# Patient Record
Sex: Male | Born: 1970 | Race: White | Hispanic: No | Marital: Single | Smoking: Current every day smoker
Health system: Southern US, Community
[De-identification: ages and names within clinical notes are randomized; demographics above are authoritative.]

## PROBLEM LIST (undated history)

## (undated) DIAGNOSIS — I639 Cerebral infarction, unspecified: Secondary | ICD-10-CM

## (undated) DIAGNOSIS — D6861 Antiphospholipid syndrome: Secondary | ICD-10-CM

## (undated) DIAGNOSIS — I82409 Acute embolism and thrombosis of unspecified deep veins of unspecified lower extremity: Secondary | ICD-10-CM

## (undated) DIAGNOSIS — I10 Essential (primary) hypertension: Secondary | ICD-10-CM

## (undated) DIAGNOSIS — R319 Hematuria, unspecified: Secondary | ICD-10-CM

## (undated) DIAGNOSIS — G8929 Other chronic pain: Secondary | ICD-10-CM

## (undated) DIAGNOSIS — M549 Dorsalgia, unspecified: Secondary | ICD-10-CM

## (undated) DIAGNOSIS — D759 Disease of blood and blood-forming organs, unspecified: Secondary | ICD-10-CM

## (undated) DIAGNOSIS — I739 Peripheral vascular disease, unspecified: Secondary | ICD-10-CM

## (undated) HISTORY — PX: CARPAL TUNNEL RELEASE: SHX101

## (undated) HISTORY — DX: Antiphospholipid syndrome: D68.61

## (undated) HISTORY — DX: Essential (primary) hypertension: I10

## (undated) HISTORY — PX: GALLBLADDER SURGERY: SHX652

## (undated) HISTORY — PX: THROMBECTOMY: SHX45

## (undated) HISTORY — PX: TOE SURGERY: SHX1073

## (undated) HISTORY — PX: HERNIA REPAIR: SHX51

## (undated) HISTORY — DX: Acute embolism and thrombosis of unspecified deep veins of unspecified lower extremity: I82.409

---

## 2012-01-02 ENCOUNTER — Encounter: Payer: Self-pay | Admitting: Family

## 2012-01-02 ENCOUNTER — Ambulatory Visit (INDEPENDENT_AMBULATORY_CARE_PROVIDER_SITE_OTHER): Payer: Self-pay | Admitting: Family

## 2012-01-02 VITALS — BP 140/92 | HR 100 | Temp 98.5°F | Ht 71.0 in | Wt 268.0 lb

## 2012-01-02 DIAGNOSIS — Z0289 Encounter for other administrative examinations: Secondary | ICD-10-CM

## 2012-01-02 LAB — POCT URINALYSIS DIPSTICK
Bilirubin, UA: NEGATIVE
Glucose, UA: NEGATIVE
Ketones, UA: NEGATIVE
Spec Grav, UA: 1.02
Urobilinogen, UA: 0.2

## 2012-01-02 NOTE — Progress Notes (Signed)
  Subjective:    Patient ID: Dominic Walters, male    DOB: 1970-12-08, 41 y.o.   MRN: [id number]  HPI 41 year old, white male, nonsmoker is in today for a DOT Physical Exam. Denies any concerns today.    Review of Systems  Constitutional: Negative.   HENT: Negative.   Eyes: Negative.   Respiratory: Negative.   Cardiovascular: Negative.   Gastrointestinal: Negative.   Genitourinary: Negative.   Musculoskeletal: Negative.   Skin: Negative.   Neurological: Negative.   Psychiatric/Behavioral: Negative.    Past Medical History  Diagnosis Date  . Hypertension     History   Social History  . Marital Status: Single    Spouse Name: N/A    Number of Children: N/A  . Years of Education: N/A   Occupational History  . Not on file.   Social History Main Topics  . Smoking status: Current Every Day Smoker -- 1.0 packs/day    Types: Cigarettes  . Smokeless tobacco: Not on file  . Alcohol Use: No  . Drug Use: No  . Sexually Active: Not on file   Other Topics Concern  . Not on file   Social History Narrative  . No narrative on file    Past Surgical History  Procedure Date  . Carpal tunnel release     Family History  Problem Relation Age of Onset  . Arthritis Mother   . Hypertension Mother   . Cancer Father     lung  . Heart disease Father   . Diabetes Maternal Grandmother   . Hypertension Maternal Grandmother   . Heart disease Maternal Grandmother     No Known Allergies  Current Outpatient Prescriptions on File Prior to Visit  Medication Sig Dispense Refill  . lisinopril (PRINIVIL,ZESTRIL) 5 MG tablet Take 5 mg by mouth daily.        BP 140/92  Pulse 100  Temp 98.5 F (36.9 C) (Oral)  Ht 5\' 11"  (1.803 m)  Wt 268 lb (121.564 kg)  BMI 37.38 kg/m2  SpO2 98%chart    Objective:   Physical Exam  Constitutional: He is oriented to person, place, and time. He appears well-developed and well-nourished.  HENT:  Head: Normocephalic and atraumatic.  Right Ear:  External ear normal.  Left Ear: External ear normal.  Nose: Nose normal.  Mouth/Throat: Oropharynx is clear and moist.  Eyes: Conjunctivae normal and EOM are normal. Pupils are equal, round, and reactive to light.  Neck: Normal range of motion. Neck supple. No thyromegaly present.  Cardiovascular: Normal rate, regular rhythm and normal heart sounds.   Pulmonary/Chest: Effort normal and breath sounds normal.  Abdominal: Soft. Bowel sounds are normal.  Musculoskeletal: Normal range of motion.  Neurological: He is alert and oriented to person, place, and time. He has normal reflexes.  Skin: Skin is warm and dry.  Psychiatric: He has a normal mood and affect.     Vision: 20/20 Hearing exam normal     Assessment & Plan:  Assessment: DOT physical exam  Plan: Form completed. Advised CPX with prostate exam and fasting blood work. Call the office with any questions or concerns. Recheck as scheduled

## 2012-10-21 ENCOUNTER — Ambulatory Visit: Payer: Self-pay | Admitting: Family

## 2012-10-21 ENCOUNTER — Ambulatory Visit (INDEPENDENT_AMBULATORY_CARE_PROVIDER_SITE_OTHER): Payer: BC Managed Care – PPO | Admitting: Family

## 2012-10-21 ENCOUNTER — Encounter: Payer: Self-pay | Admitting: Family

## 2012-10-21 VITALS — BP 122/80 | HR 67 | Wt 288.6 lb

## 2012-10-21 DIAGNOSIS — M5416 Radiculopathy, lumbar region: Secondary | ICD-10-CM

## 2012-10-21 DIAGNOSIS — I10 Essential (primary) hypertension: Secondary | ICD-10-CM

## 2012-10-21 DIAGNOSIS — IMO0002 Reserved for concepts with insufficient information to code with codable children: Secondary | ICD-10-CM

## 2012-10-21 MED ORDER — HYDROCODONE-ACETAMINOPHEN 10-325 MG PO TABS: 1.0000 | ORAL_TABLET | Freq: Three times a day (TID) | ORAL | Status: DC | PRN

## 2012-10-21 MED ORDER — PREDNISONE 20 MG PO TABS: ORAL_TABLET | ORAL | Status: DC

## 2012-10-21 NOTE — Progress Notes (Signed)
Subjective:    Patient ID: Dominic Walters, male    DOB: [birth year], 42 y.o.   MRN: [id number]  HPI 42 year old white male, one pack per day smoker is in with complaints of pain in the right sciatic nerve area x1 year. The pain has recently intensified and is not responding to ibuprofen and naproxen anymore. He had been taken an anti-inflammatory daily. He rates the pain as 6-7/10, worse with certain movements. He's also been trying massage his biweekly that initially were held and then a longer helping. The pain radiates to his right gluteal area down the leg into the right foot. He describes it as a dull ache. Reports a back injury in the 1990s requiring cortisone injections at that time. He has not had any new images of his back.  Patient is currently taking lisinopril 5 mg once daily for his blood pressure and tolerating it well.   Review of Systems  Constitutional: Negative.   HENT: Negative.   Respiratory: Negative.   Gastrointestinal: Negative.   Endocrine: Negative.   Musculoskeletal: Positive for back pain.       Radiates down the right leg  Skin: Negative.   Neurological: Negative.   Psychiatric/Behavioral: Negative.    Past Medical History  Diagnosis Date  . Hypertension     History   Social History  . Marital Status: Single    Spouse Name: N/A    Number of Children: N/A  . Years of Education: N/A   Occupational History  . Not on file.   Social History Main Topics  . Smoking status: Former Smoker -- 1.00 packs/day    Types: Cigarettes  . Smokeless tobacco: Not on file  . Alcohol Use: No  . Drug Use: No  . Sexual Activity: Not on file   Other Topics Concern  . Not on file   Social History Narrative  . No narrative on file    Past Surgical History  Procedure Laterality Date  . Carpal tunnel release      Family History  Problem Relation Age of Onset  . Arthritis Mother   . Hypertension Mother   . Cancer Father     lung  . Heart disease Father    . Diabetes Maternal Grandmother   . Hypertension Maternal Grandmother   . Heart disease Maternal Grandmother     No Known Allergies  Current Outpatient Prescriptions on File Prior to Visit  Medication Sig Dispense Refill  . lisinopril (PRINIVIL,ZESTRIL) 5 MG tablet Take 5 mg by mouth daily.       No current facility-administered medications on file prior to visit.    BP 122/80  Pulse 67  Wt 288 lb 9.6 oz (130.908 kg)  BMI 40.27 kg/m2chart    Objective:   Physical Exam  Constitutional: He is oriented to person, place, and time. He appears well-developed and well-nourished.  HENT:  Right Ear: External ear normal.  Left Ear: External ear normal.  Nose: Nose normal.  Mouth/Throat: Oropharynx is clear and moist.  Neck: Normal range of motion. Neck supple.  Cardiovascular: Normal rate, regular rhythm and normal heart sounds.   Pulmonary/Chest: Effort normal and breath sounds normal.  Abdominal: Soft. Bowel sounds are normal.  Musculoskeletal: He exhibits tenderness.  No pain to tenderness of the low back. Pain to tenderness of the right sciatic nerve. Negative SLR. Pulses 2/2  Neurological: He is alert and oriented to person, place, and time. He has normal reflexes. He displays normal reflexes. No cranial nerve deficit.  Coordination normal.  Skin: Skin is warm and dry.  Psychiatric: He has a normal mood and affect.          Assessment & Plan:  Assessment: 1. Lumbar Radiculopathy 2. Hypertension  Plan: Prednisone as directed. We will consider an MRI of the back if his symptoms persist. Low back strengthening exercises provided for him today. I will recheck the patient in 2 weeks to see if we need to order an MRI at that time or if he is better.

## 2012-10-21 NOTE — Patient Instructions (Addendum)

## 2012-10-28 ENCOUNTER — Telehealth: Payer: Self-pay | Admitting: Family

## 2012-10-28 ENCOUNTER — Other Ambulatory Visit: Payer: Self-pay | Admitting: Family

## 2012-10-28 ENCOUNTER — Ambulatory Visit (HOSPITAL_BASED_OUTPATIENT_CLINIC_OR_DEPARTMENT_OTHER)
Admission: RE | Admit: 2012-10-28 | Discharge: 2012-10-28 | Disposition: A | Discharge status: Home / Self Care | Payer: BC Managed Care – PPO | Source: Ambulatory Visit | Attending: Family | Admitting: Family

## 2012-10-28 DIAGNOSIS — M545 Low back pain, unspecified: Secondary | ICD-10-CM | POA: Insufficient documentation

## 2012-10-28 DIAGNOSIS — M5431 Sciatica, right side: Secondary | ICD-10-CM

## 2012-10-28 DIAGNOSIS — M79609 Pain in unspecified limb: Secondary | ICD-10-CM | POA: Insufficient documentation

## 2012-10-28 DIAGNOSIS — M5126 Other intervertebral disc displacement, lumbar region: Secondary | ICD-10-CM | POA: Insufficient documentation

## 2012-10-28 MED ORDER — PREDNISONE 20 MG PO TABS: 60.0000 mg | ORAL_TABLET | Freq: Every day | ORAL | Status: DC

## 2012-10-28 NOTE — Telephone Encounter (Signed)
Pt f/u office call regarding MRI.  Advised Pt order was placed on 9-2 for MRI Lumbar by Padonda, PA.  Pt verbalized understanding.

## 2012-10-29 ENCOUNTER — Other Ambulatory Visit: Payer: Self-pay | Admitting: Family

## 2012-10-29 DIAGNOSIS — IMO0002 Reserved for concepts with insufficient information to code with codable children: Secondary | ICD-10-CM

## 2012-10-29 DIAGNOSIS — M5126 Other intervertebral disc displacement, lumbar region: Secondary | ICD-10-CM

## 2012-10-31 ENCOUNTER — Encounter: Payer: Self-pay | Admitting: Family

## 2012-11-03 ENCOUNTER — Telehealth: Payer: Self-pay | Admitting: Family

## 2012-11-03 MED ORDER — PREDNISONE 20 MG PO TABS: 60.0000 mg | ORAL_TABLET | Freq: Every day | ORAL | Status: AC

## 2012-11-03 MED ORDER — OXYCODONE-ACETAMINOPHEN 10-325 MG PO TABS: 1.0000 | ORAL_TABLET | Freq: Three times a day (TID) | ORAL | Status: DC | PRN

## 2012-11-03 NOTE — Telephone Encounter (Signed)
Pt would like to discuss the meds :predniSONE (DELTASONE) 20 MG tablet and HYDROcodone-acetaminophen (NORCO) 10-325 MG per tablet Pt refused to give any more info. Pls call

## 2012-11-03 NOTE — Telephone Encounter (Signed)
Pick up RX

## 2012-11-04 DIAGNOSIS — I639 Cerebral infarction, unspecified: Secondary | ICD-10-CM

## 2012-11-04 HISTORY — DX: Cerebral infarction, unspecified: I63.9

## 2012-11-06 ENCOUNTER — Ambulatory Visit (INDEPENDENT_AMBULATORY_CARE_PROVIDER_SITE_OTHER): Payer: BC Managed Care – PPO | Admitting: Family

## 2012-11-06 ENCOUNTER — Encounter: Payer: Self-pay | Admitting: Family

## 2012-11-06 VITALS — BP 138/82 | HR 78 | Wt 278.0 lb

## 2012-11-06 DIAGNOSIS — I10 Essential (primary) hypertension: Secondary | ICD-10-CM

## 2012-11-06 DIAGNOSIS — H8110 Benign paroxysmal vertigo, unspecified ear: Secondary | ICD-10-CM

## 2012-11-06 DIAGNOSIS — F430 Acute stress reaction: Secondary | ICD-10-CM

## 2012-11-06 LAB — TSH: TSH: 0.63 u[IU]/mL (ref 0.35–5.50)

## 2012-11-06 MED ORDER — DIAZEPAM 5 MG PO TABS: 5.0000 mg | ORAL_TABLET | Freq: Two times a day (BID) | ORAL | Status: DC | PRN

## 2012-11-06 NOTE — Patient Instructions (Signed)

## 2012-11-06 NOTE — Progress Notes (Signed)
Subjective:    Patient ID: Dominic Walters, male    DOB: [birth year], 42 y.o.   MRN: [id number]  HPI  42 year old white male, nonsmoker is in today for hospital followup. He was seen 2 days ago at [facility] with sudden onset of dizziness slow speech that was presumed to be a CVA. He had a CT scan and MRI of the brain that were both negative. Cardiac enzymes were negative. All blood work was normal. He was diagnosed with benign paroxysmal vertigo and given meclizine. Since that time continued to have dizziness that responds well to meclizine but it causes drowsiness. He's been unable to ambulate without assistance due to intense dizziness. His fiance is concerned that he may have mercury poison because the night before he had approximately 100 pieces of sushi. She was seen by neurosurgery yesterday as he has a bulging disc in his lower back. His symptoms were not thought to be related to that. Has a family history of Mnire's disease his mother  Patient has been under an increased amount of stress recently. 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 He also recently found out about the bulging disc in his back potentially requiring surgical intervention. Fiance reports that he typically is anxious anyway.   Review of Systems  Constitutional: Negative.   HENT: Negative.  Negative for hearing loss, ear pain, congestion, sneezing, tinnitus and ear discharge.   Respiratory: Negative.   Cardiovascular: Negative.   Genitourinary: Negative.   Musculoskeletal: Negative.        Dizziness with ambulating  Skin: Negative.   Neurological: Positive for dizziness. Negative for facial asymmetry, speech difficulty and headaches.  Hematological: Negative.   Psychiatric/Behavioral: Positive for agitation. Negative for confusion and sleep disturbance. The patient is nervous/anxious.        Increased stress   Past Medical History  Diagnosis Date  . Hypertension      History   Social History  . Marital Status: Single    Spouse Name: N/A    Number of Children: N/A  . Years of Education: N/A   Occupational History  . Not on file.   Social History Main Topics  . Smoking status: Former Smoker -- 1.00 packs/day    Types: Cigarettes  . Smokeless tobacco: Not on file  . Alcohol Use: No  . Drug Use: No  . Sexual Activity: Not on file   Other Topics Concern  . Not on file   Social History Narrative  . No narrative on file    Past Surgical History  Procedure Laterality Date  . Carpal tunnel release      Family History  Problem Relation Age of Onset  . Arthritis Mother   . Hypertension Mother   . Cancer Father     lung  . Heart disease Father   . Diabetes Maternal Grandmother   . Hypertension Maternal Grandmother   . Heart disease Maternal Grandmother     No Known Allergies  Current Outpatient Prescriptions on File Prior to Visit  Medication Sig Dispense Refill  . lisinopril (PRINIVIL,ZESTRIL) 5 MG tablet Take 5 mg by mouth daily.      Marland Kitchen oxyCODONE-acetaminophen (PERCOCET) 10-325 MG per tablet Take 1 tablet by mouth every 8 (eight) hours as needed for pain.  60 tablet  0  . predniSONE (DELTASONE) 20 MG tablet Take 3 tablets (60 mg total) by mouth daily.  24 tablet  0   No  current facility-administered medications on file prior to visit.    BP 138/82  Pulse 78  Wt 278 lb (126.1 kg)  BMI 38.79 kg/m2chart    Objective:   Physical Exam  Constitutional: He is oriented to person, place, and time. He appears well-developed and well-nourished.  HENT:  Right Ear: External ear normal.  Left Ear: External ear normal.  Eyes: Conjunctivae are normal. Pupils are equal, round, and reactive to light.  Neck: Normal range of motion. Neck supple. No thyromegaly present.  Cardiovascular: Normal rate, regular rhythm and normal heart sounds.   Pulmonary/Chest: Effort normal and breath sounds normal.  Abdominal: Soft. Bowel sounds are  normal.  Musculoskeletal: Normal range of motion.  Neurological: He is alert and oriented to person, place, and time. He has normal reflexes. He displays normal reflexes. No cranial nerve deficit. Coordination normal.  Skin: Skin is warm and dry.  Psychiatric: He has a normal mood and affect.          Assessment & Plan:  Assessment: 1.

## 2012-11-13 ENCOUNTER — Encounter: Payer: Self-pay | Admitting: Family

## 2012-11-13 DIAGNOSIS — M5126 Other intervertebral disc displacement, lumbar region: Secondary | ICD-10-CM | POA: Insufficient documentation

## 2012-11-13 DIAGNOSIS — H8110 Benign paroxysmal vertigo, unspecified ear: Secondary | ICD-10-CM | POA: Insufficient documentation

## 2012-11-13 DIAGNOSIS — I10 Essential (primary) hypertension: Secondary | ICD-10-CM | POA: Insufficient documentation

## 2012-11-21 LAB — HEAVY METALS SCREEN, URINE
Arsenic, 24H Ur: 10 mcg/L (ref <81)
Mercury 24 Hr Urine: <2 mcg/L (ref <21)

## 2012-12-16 ENCOUNTER — Encounter: Payer: Self-pay | Admitting: Family

## 2012-12-16 ENCOUNTER — Ambulatory Visit (INDEPENDENT_AMBULATORY_CARE_PROVIDER_SITE_OTHER): Payer: BC Managed Care – PPO | Admitting: Family

## 2012-12-16 VITALS — BP 98/78 | HR 103 | Wt 282.0 lb

## 2012-12-16 DIAGNOSIS — Z23 Encounter for immunization: Secondary | ICD-10-CM

## 2012-12-16 DIAGNOSIS — M545 Low back pain, unspecified: Secondary | ICD-10-CM | POA: Insufficient documentation

## 2012-12-16 DIAGNOSIS — Z79899 Other long term (current) drug therapy: Secondary | ICD-10-CM | POA: Insufficient documentation

## 2012-12-16 DIAGNOSIS — I82621 Acute embolism and thrombosis of deep veins of right upper extremity: Secondary | ICD-10-CM | POA: Insufficient documentation

## 2012-12-16 DIAGNOSIS — G8929 Other chronic pain: Secondary | ICD-10-CM

## 2012-12-16 DIAGNOSIS — F411 Generalized anxiety disorder: Secondary | ICD-10-CM | POA: Insufficient documentation

## 2012-12-16 DIAGNOSIS — I82629 Acute embolism and thrombosis of deep veins of unspecified upper extremity: Secondary | ICD-10-CM

## 2012-12-16 MED ORDER — OXYCODONE-ACETAMINOPHEN 10-325 MG PO TABS: 1.0000 | ORAL_TABLET | Freq: Three times a day (TID) | ORAL | Status: DC | PRN

## 2012-12-16 MED ORDER — CLONAZEPAM 1 MG PO TABS: 1.0000 mg | ORAL_TABLET | Freq: Two times a day (BID) | ORAL | Status: DC | PRN

## 2012-12-16 MED ORDER — DULOXETINE HCL 30 MG PO CPEP: 30.0000 mg | ORAL_CAPSULE | Freq: Two times a day (BID) | ORAL | Status: DC

## 2012-12-16 NOTE — Patient Instructions (Signed)

## 2012-12-16 NOTE — Progress Notes (Signed)
Subjective:    Patient ID: Dominic Walters, male    DOB: 05-25-1970, 42 y.o.   MRN: [id number]  HPI 42 year old white male, in for a post hospital followup after having a DVT in the right upper extremity. He had nearly 100% occlusion and underwent emergency surgery. He now has mild pain into his right hand and fingertips. He is on Lovenox twice a day per vascular until he sees them in December. Pain has significantly decreased in comparison to prior to surgery. He now has difficulty sleeping due to worrying and stress. He taken iron in the past that helps him to calm down but has not helped him to get any rest.   Review of Systems  Constitutional: Negative.   HENT: Negative.   Respiratory: Negative.   Cardiovascular: Negative.   Gastrointestinal: Negative.   Endocrine: Negative.   Genitourinary: Negative.   Musculoskeletal: Negative.   Neurological: Positive for numbness.       Pain in right arm/hand.   Hematological: Negative.   Psychiatric/Behavioral: Positive for sleep disturbance.   Past Medical History  Diagnosis Date  . Hypertension     History   Social History  . Marital Status: Single    Spouse Name: N/A    Number of Children: N/A  . Years of Education: N/A   Occupational History  . Not on file.   Social History Main Topics  . Smoking status: Former Smoker -- 1.00 packs/day    Types: Cigarettes  . Smokeless tobacco: Not on file  . Alcohol Use: No  . Drug Use: No  . Sexual Activity: Not on file   Other Topics Concern  . Not on file   Social History Narrative  . No narrative on file    Past Surgical History  Procedure Laterality Date  . Carpal tunnel release      Family History  Problem Relation Age of Onset  . Arthritis Mother   . Hypertension Mother   . Cancer Father     lung  . Heart disease Father   . Diabetes Maternal Grandmother   . Hypertension Maternal Grandmother   . Heart disease Maternal Grandmother     No Known  Allergies  Current Outpatient Prescriptions on File Prior to Visit  Medication Sig Dispense Refill  . lisinopril (PRINIVIL,ZESTRIL) 5 MG tablet Take 5 mg by mouth daily.       No current facility-administered medications on file prior to visit.    BP 98/78  Pulse 103  Wt 282 lb (127.914 kg)  BMI 39.35 kg/m2chart    Objective:   Physical Exam  Constitutional: He is oriented to person, place, and time. He appears well-developed and well-nourished.  Neck: Normal range of motion. Neck supple.  Cardiovascular: Normal rate, regular rhythm and normal heart sounds.   Pulmonary/Chest: Effort normal and breath sounds normal.  Abdominal: Soft. Bowel sounds are normal.  Musculoskeletal: Normal range of motion.  Neurological: He is alert and oriented to person, place, and time. He has normal reflexes. No cranial nerve deficit. Coordination normal.  Skin: Skin is warm and dry.  Psychiatric: He has a normal mood and affect.          Assessment & Plan:  Assessment: 1. Right arm DVT 2. High risk medication usage 3. Insomnia 4. Stress reaction in an in and  Plan: Follow up with specialist as scheduled. DC Valium. Start Cymbalta 30 mg once a day x1 week then increase to 60 mg. Klonopin 1 mg one half tablet  twice a day and one full tablet at bedtime. Hopefully this may combination might help with neuropathic pain, spleen, and anxiety.

## 2013-01-15 ENCOUNTER — Encounter: Payer: Self-pay | Admitting: *Deleted

## 2013-01-16 ENCOUNTER — Ambulatory Visit (INDEPENDENT_AMBULATORY_CARE_PROVIDER_SITE_OTHER): Payer: BC Managed Care – PPO | Admitting: Family

## 2013-01-16 ENCOUNTER — Encounter: Payer: Self-pay | Admitting: Family

## 2013-01-16 VITALS — BP 138/90 | HR 81 | Wt 287.0 lb

## 2013-01-16 DIAGNOSIS — M549 Dorsalgia, unspecified: Secondary | ICD-10-CM

## 2013-01-16 DIAGNOSIS — F32A Depression, unspecified: Secondary | ICD-10-CM

## 2013-01-16 DIAGNOSIS — IMO0002 Reserved for concepts with insufficient information to code with codable children: Secondary | ICD-10-CM

## 2013-01-16 DIAGNOSIS — M5416 Radiculopathy, lumbar region: Secondary | ICD-10-CM

## 2013-01-16 DIAGNOSIS — G8929 Other chronic pain: Secondary | ICD-10-CM

## 2013-01-16 DIAGNOSIS — I10 Essential (primary) hypertension: Secondary | ICD-10-CM

## 2013-01-16 DIAGNOSIS — F329 Major depressive disorder, single episode, unspecified: Secondary | ICD-10-CM

## 2013-01-16 MED ORDER — OXYCODONE-ACETAMINOPHEN 10-325 MG PO TABS: 1.0000 | ORAL_TABLET | Freq: Four times a day (QID) | ORAL | Status: DC | PRN

## 2013-01-16 NOTE — Progress Notes (Signed)
Subjective:    Patient ID: Dominic Walters, male    DOB: 10-31-1970, 42 y.o.   MRN: [id number]  HPI 42 year old white male, nonsmoker is in today for recheck of chronic low back pain, lumbar radiculopathy, deep vein thrombosis of the right upper extremity, CVA. He continues to be under the care of vascular and neurosurgery. He has an appointment with vascular December 10. Due to the complexity he's currently on Lovenox injections taken twice a day until day can determine the cause of the embolisms. He is hoping to be taken off of Lovenox injections at his next office visit with vascular. He's taken Cymbalta 60 mg that's working well to help depression. Is also taking Neurontin 900 mg a day but is unsure if this really helped his back pain much. States the back pain is 6-8/10. Prednisone typically works well. He has Percocet he takes as needed for pain. Requesting a new prescription today. Is not currently on medication for blood pressure.    Review of Systems  Constitutional: Negative.   Respiratory: Negative.   Cardiovascular: Negative.   Gastrointestinal: Negative.   Endocrine: Negative.   Genitourinary: Negative.   Musculoskeletal: Positive for back pain.  Skin: Negative.   Allergic/Immunologic: Negative.   Neurological: Negative for numbness.       Pain radiates down the right leg  Hematological: Negative.   Psychiatric/Behavioral: Negative.    Past Medical History  Diagnosis Date  . Hypertension     History   Social History  . Marital Status: Single    Spouse Name: N/A    Number of Children: N/A  . Years of Education: N/A   Occupational History  . Not on file.   Social History Main Topics  . Smoking status: Former Smoker -- 1.00 packs/day    Types: Cigarettes  . Smokeless tobacco: Not on file  . Alcohol Use: No  . Drug Use: No  . Sexual Activity: Not on file   Other Topics Concern  . Not on file   Social History Narrative  . No narrative on file    Past  Surgical History  Procedure Laterality Date  . Carpal tunnel release      Family History  Problem Relation Age of Onset  . Arthritis Mother   . Hypertension Mother   . Cancer Father     lung  . Heart disease Father   . Diabetes Maternal Grandmother   . Hypertension Maternal Grandmother   . Heart disease Maternal Grandmother     No Known Allergies  Current Outpatient Prescriptions on File Prior to Visit  Medication Sig Dispense Refill  . atorvastatin (LIPITOR) 40 MG tablet Take 40 mg by mouth daily.       . clonazePAM (KLONOPIN) 1 MG tablet Take 1 tablet (1 mg total) by mouth 2 (two) times daily as needed for anxiety.  60 tablet  3  . DULoxetine (CYMBALTA) 30 MG capsule Take 1 capsule (30 mg total) by mouth 2 (two) times daily.  60 capsule  3  . enoxaparin (LOVENOX) 120 MG/0.8ML injection Inject 120 mg into the skin every 12 (twelve) hours.       . gabapentin (NEURONTIN) 100 MG capsule Take 100 mg by mouth 3 (three) times daily.       Marland Kitchen lisinopril (PRINIVIL,ZESTRIL) 5 MG tablet Take 5 mg by mouth daily.       No current facility-administered medications on file prior to visit.    BP 138/90  Pulse 81  Wt 287  lb (130.182 kg)chart    Objective:   Physical Exam  Constitutional: He is oriented to person, place, and time. He appears well-developed and well-nourished.  Neck: Normal range of motion. Neck supple.  Cardiovascular: Normal rate, regular rhythm and normal heart sounds.   Pulmonary/Chest: Effort normal and breath sounds normal.  Abdominal: Soft. Bowel sounds are normal.  Musculoskeletal: He exhibits tenderness.  Tenderness to palpation of the right gluteal area in the sciatic region. Negative straight leg raise  Neurological: He is alert and oriented to person, place, and time. He has normal reflexes. He displays normal reflexes. No cranial nerve deficit. Coordination normal.  Skin: Skin is warm and dry.  Psychiatric: He has a normal mood and affect.           Assessment & Plan:  Assessment: 1. Chronic back pain 2. Lumbar radiculopathy 3. Deep vein thrombosis of the right upper extremity 4. History of CVA 5. Depression  Plan: Continue Lovenox injections as directed under vascular. Continue pain medication regimen. At this point, he has a referral to pain clinic but they will not do any epidural injections while he is on Lovenox. I've advised that we don't do chronic pain management here but are in a tough position with regards to keeping him comfortable given his other comorbidities. Return for recheck in one month

## 2013-01-16 NOTE — Patient Instructions (Signed)
Sciatica °Sciatica is pain, weakness, numbness, or tingling along the path of the sciatic nerve. The nerve starts in the lower back and runs down the back of each leg. The nerve controls the muscles in the lower leg and in the back of the knee, while also providing sensation to the back of the thigh, lower leg, and the sole of your foot. Sciatica is a symptom of another medical condition. For instance, nerve damage or certain conditions, such as a herniated disk or bone spur on the spine, pinch or put pressure on the sciatic nerve. This causes the pain, weakness, or other sensations normally associated with sciatica. Generally, sciatica only affects one side of the body. °CAUSES  °· Herniated or slipped disc. °· Degenerative disk disease. °· A pain disorder involving the narrow muscle in the buttocks (piriformis syndrome). °· Pelvic injury or fracture. °· Pregnancy. °· Tumor (rare). °SYMPTOMS  °Symptoms can vary from mild to very severe. The symptoms usually travel from the low back to the buttocks and down the back of the leg. Symptoms can include: °· Mild tingling or dull aches in the lower back, leg, or hip. °· Numbness in the back of the calf or sole of the foot. °· Burning sensations in the lower back, leg, or hip. °· Sharp pains in the lower back, leg, or hip. °· Leg weakness. °· Severe back pain inhibiting movement. °These symptoms may get worse with coughing, sneezing, laughing, or prolonged sitting or standing. Also, being overweight may worsen symptoms. °DIAGNOSIS  °Your caregiver will perform a physical exam to look for common symptoms of sciatica. He or she may ask you to do certain movements or activities that would trigger sciatic nerve pain. Other tests may be performed to find the cause of the sciatica. These may include: °· Blood tests. °· X-rays. °· Imaging tests, such as an MRI or CT scan. °TREATMENT  °Treatment is directed at the cause of the sciatic pain. Sometimes, treatment is not necessary  and the pain and discomfort goes away on its own. If treatment is needed, your caregiver may suggest: °· Over-the-counter medicines to relieve pain. °· Prescription medicines, such as anti-inflammatory medicine, muscle relaxants, or narcotics. °· Applying heat or ice to the painful area. °· Steroid injections to lessen pain, irritation, and inflammation around the nerve. °· Reducing activity during periods of pain. °· Exercising and stretching to strengthen your abdomen and improve flexibility of your spine. Your caregiver may suggest losing weight if the extra weight makes the back pain worse. °· Physical therapy. °· Surgery to eliminate what is pressing or pinching the nerve, such as a bone spur or part of a herniated disk. °HOME CARE INSTRUCTIONS  °· Only take over-the-counter or prescription medicines for pain or discomfort as directed by your caregiver. °· Apply ice to the affected area for 20 minutes, 3 4 times a day for the first 48 72 hours. Then try heat in the same way. °· Exercise, stretch, or perform your usual activities if these do not aggravate your pain. °· Attend physical therapy sessions as directed by your caregiver. °· Keep all follow-up appointments as directed by your caregiver. °· Do not wear high heels or shoes that do not provide proper support. °· Check your mattress to see if it is too soft. A firm mattress may lessen your pain and discomfort. °SEEK IMMEDIATE MEDICAL CARE IF:  °· You lose control of your bowel or bladder (incontinence). °· You have increasing weakness in the lower back,   pelvis, buttocks, or legs. °· You have redness or swelling of your back. °· You have a burning sensation when you urinate. °· You have pain that gets worse when you lie down or awakens you at night. °· Your pain is worse than you have experienced in the past. °· Your pain is lasting longer than 4 weeks. °· You are suddenly losing weight without reason. °MAKE SURE YOU: °· Understand these  instructions. °· Will watch your condition. °· Will get help right away if you are not doing well or get worse. °Document Released: 02/06/2001 Document Revised: 08/14/2011 Document Reviewed: 06/24/2011 °[employer]® Patient Information ©2014 [employer]. ° °

## 2013-02-02 ENCOUNTER — Other Ambulatory Visit: Payer: Self-pay | Admitting: Family

## 2013-02-02 MED ORDER — PREDNISONE 20 MG PO TABS: ORAL_TABLET | ORAL | Status: DC

## 2013-02-12 ENCOUNTER — Ambulatory Visit (INDEPENDENT_AMBULATORY_CARE_PROVIDER_SITE_OTHER): Payer: BC Managed Care – PPO | Admitting: Family

## 2013-02-12 ENCOUNTER — Encounter: Payer: Self-pay | Admitting: Family

## 2013-02-12 VITALS — BP 122/80 | HR 97 | Wt 283.0 lb

## 2013-02-12 DIAGNOSIS — M545 Low back pain: Secondary | ICD-10-CM

## 2013-02-12 DIAGNOSIS — I808 Phlebitis and thrombophlebitis of other sites: Secondary | ICD-10-CM

## 2013-02-12 DIAGNOSIS — G8929 Other chronic pain: Secondary | ICD-10-CM

## 2013-02-12 DIAGNOSIS — I10 Essential (primary) hypertension: Secondary | ICD-10-CM

## 2013-02-12 DIAGNOSIS — I82B11 Acute embolism and thrombosis of right subclavian vein: Secondary | ICD-10-CM

## 2013-02-12 DIAGNOSIS — D689 Coagulation defect, unspecified: Secondary | ICD-10-CM

## 2013-02-12 MED ORDER — DIAZEPAM 10 MG PO TABS: 10.0000 mg | ORAL_TABLET | Freq: Two times a day (BID) | ORAL | Status: DC | PRN

## 2013-02-12 MED ORDER — LISINOPRIL 20 MG PO TABS: 20.0000 mg | ORAL_TABLET | Freq: Every day | ORAL | Status: DC

## 2013-02-12 MED ORDER — PREDNISONE 10 MG PO TABS: 10.0000 mg | ORAL_TABLET | Freq: Every day | ORAL | Status: DC

## 2013-02-12 MED ORDER — GABAPENTIN 400 MG PO CAPS: 400.0000 mg | ORAL_CAPSULE | Freq: Three times a day (TID) | ORAL | Status: DC

## 2013-02-12 MED ORDER — OXYCODONE-ACETAMINOPHEN 10-325 MG PO TABS: 1.0000 | ORAL_TABLET | Freq: Four times a day (QID) | ORAL | Status: DC | PRN

## 2013-02-12 NOTE — Patient Instructions (Signed)
Back Exercises Back exercises help treat and prevent back injuries. The goal of back exercises is to increase the strength of your abdominal and back muscles and the flexibility of your back. These exercises should be started when you no longer have back pain. Back exercises include:  Pelvic Tilt. Lie on your back with your knees bent. Tilt your pelvis until the lower part of your back is against the floor. Hold this position 5 to 10 sec and repeat 5 to 10 times.  Knee to Chest. Pull first 1 knee up against your chest and hold for 20 to 30 seconds, repeat this with the other knee, and then both knees. This may be done with the other leg straight or bent, whichever feels better.  Sit-Ups or Curl-Ups. Bend your knees 90 degrees. Start with tilting your pelvis, and do a partial, slow sit-up, lifting your trunk only 30 to 45 degrees off the floor. Take at least 2 to 3 seconds for each sit-up. Do not do sit-ups with your knees out straight. If partial sit-ups are difficult, simply do the above but with only tightening your abdominal muscles and holding it as directed.  Hip-Lift. Lie on your back with your knees flexed 90 degrees. Push down with your feet and shoulders as you raise your hips a couple inches off the floor; hold for 10 seconds, repeat 5 to 10 times.  Back arches. Lie on your stomach, propping yourself up on bent elbows. Slowly press on your hands, causing an arch in your low back. Repeat 3 to 5 times. Any initial stiffness and discomfort should lessen with repetition over time.  Shoulder-Lifts. Lie face down with arms beside your body. Keep hips and torso pressed to floor as you slowly lift your head and shoulders off the floor. Do not overdo your exercises, especially in the beginning. Exercises may cause you some mild back discomfort which lasts for a few minutes; however, if the pain is more severe, or lasts for more than 15 minutes, do not continue exercises until you see your caregiver.  Improvement with exercise therapy for back problems is slow.  See your caregivers for assistance with developing a proper back exercise program. Document Released: 03/22/2004 Document Revised: 05/07/2011 Document Reviewed: 12/14/2010 [employer] Patient Information 2014 [employer].  

## 2013-02-13 DIAGNOSIS — I82B11 Acute embolism and thrombosis of right subclavian vein: Secondary | ICD-10-CM | POA: Insufficient documentation

## 2013-02-13 DIAGNOSIS — D689 Coagulation defect, unspecified: Secondary | ICD-10-CM | POA: Insufficient documentation

## 2013-02-13 NOTE — Progress Notes (Signed)
Subjective:    Patient ID: Dominic Walters, male    DOB: 24-May-1970, 42 y.o.   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 Vascular has decreased Lovenox injections to once a day from twice a day. He has a followup appointment with vascular next month. Continues to have chronic low back pain related to a bulging disc. However, has not been a candidate for epidural injections due to Lovenox. He gets the best relief from prednisone. However, has when necessary Percocet as needed for pain. 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 Overall he is doing much better.   Review of Systems  Constitutional: Negative.   HENT: Negative.   Respiratory: Negative.   Cardiovascular: Negative.   Gastrointestinal: Negative.   Endocrine: Negative.   Genitourinary: Negative.   Musculoskeletal: Positive for back pain.  Skin: Negative.   Allergic/Immunologic: Negative.   Neurological: Negative.   Hematological: Does not bruise/bleed easily.       Auto immune clotting disorder  Psychiatric/Behavioral: Negative.    Past Medical History  Diagnosis Date  . Hypertension     History   Social History  . Marital Status: Single    Spouse Name: N/A    Number of Children: N/A  . Years of Education: N/A   Occupational History  . Not on file.   Social History Main Topics  . Smoking status: Former Smoker -- 1.00 packs/day    Types: Cigarettes  . Smokeless tobacco: Not on file  . Alcohol Use: No  . Drug Use: No  . Sexual Activity: Not on file   Other Topics Concern  . Not on file   Social History Narrative  . No narrative on file    Past Surgical History  Procedure Laterality Date  . Carpal tunnel release       Family History  Problem Relation Age of Onset  . Arthritis Mother   . Hypertension Mother   . Cancer Father     lung  . Heart disease Father   . Diabetes Maternal Grandmother   . Hypertension Maternal Grandmother   . Heart disease Maternal Grandmother     No Known Allergies  Current Outpatient Prescriptions on File Prior to Visit  Medication Sig Dispense Refill  . atorvastatin (LIPITOR) 40 MG tablet Take 40 mg by mouth daily.       . DULoxetine (CYMBALTA) 30 MG capsule Take 1 capsule (30 mg total) by mouth 2 (two) times daily.  60 capsule  3   No current facility-administered medications on file prior to visit.    BP 122/80  Pulse 97  Wt 283 lb (128.368 kg)chart    Objective:   Physical Exam  Constitutional: He is oriented to person, place, and time. He appears well-developed and well-nourished.  Neck: Normal range of motion. Neck supple. No thyromegaly present.  Cardiovascular: Normal rate, regular rhythm and normal heart sounds.   Pulmonary/Chest: Effort normal and breath sounds normal.  Abdominal: Soft. Bowel sounds are normal.  Musculoskeletal: Normal range of motion. He exhibits no tenderness.  Neurological: He is alert and oriented to person, place, and time. He has normal reflexes.  Skin: Skin is warm and dry.  Psychiatric: He has  a normal mood and affect.          Assessment & Plan:  Assessment: 1. Chronic low back pain 2. Right subclavian vein thrombus 3. History of stroke   Plan: Continue current medications. See hematology as scheduled. Prescription given for pain medication but with plans of seeing the pain clinic soon. In followup in 2 months and sooner as needed.

## 2013-02-24 DIAGNOSIS — E7210 Disorders of sulfur-bearing amino-acid metabolism, unspecified: Secondary | ICD-10-CM | POA: Insufficient documentation

## 2013-03-02 ENCOUNTER — Other Ambulatory Visit: Payer: Self-pay | Admitting: Family

## 2013-03-02 MED ORDER — VARENICLINE TARTRATE 0.5 MG X 11 & 1 MG X 42 PO MISC: ORAL | Status: DC

## 2013-03-10 ENCOUNTER — Ambulatory Visit (INDEPENDENT_AMBULATORY_CARE_PROVIDER_SITE_OTHER): Payer: BC Managed Care – PPO | Admitting: Family

## 2013-03-10 ENCOUNTER — Encounter: Payer: Self-pay | Admitting: Family

## 2013-03-10 VITALS — BP 122/84 | HR 98 | Ht 71.0 in | Wt 286.0 lb

## 2013-03-10 DIAGNOSIS — D6859 Other primary thrombophilia: Secondary | ICD-10-CM

## 2013-03-10 DIAGNOSIS — D6861 Antiphospholipid syndrome: Secondary | ICD-10-CM

## 2013-03-10 DIAGNOSIS — I10 Essential (primary) hypertension: Secondary | ICD-10-CM

## 2013-03-10 DIAGNOSIS — Z0000 Encounter for general adult medical examination without abnormal findings: Secondary | ICD-10-CM

## 2013-03-10 NOTE — Patient Instructions (Signed)
Did not take any Coumadin x4 days. See hematology as scheduled on Friday. They will recheck INR and advise you for further dosing. Complaining of green vegetables.  Warfarin: What You Need to Know Warfarin is an anticoagulant. Anticoagulants help prevent the formation of blood clots. They also help stop the growth of blood clots. Warfarin is sometimes referred to as a "blood thinner."  Normally, when body tissues are cut or damaged, the blood clots in order to prevent blood loss. Sometimes clots form inside your blood vessels and obstruct the flow of blood through your circulatory system (thrombosis). These clots may travel through your bloodstream and become lodged in smaller blood vessels in your brain, which can cause a stroke, or your lungs (pulmonary embolism). WHO SHOULD USE WARFARIN? Warfarin is prescribed for people at risk of developing harmful blood clots:  People with surgically implanted mechanical heart valves, irregular heart rhythms called atrial fibrillation, and certain clotting disorders.  People who have developed harmful blood clotting in the past, including those who have had a stroke or a pulmonary embolism, or thrombosis in their legs (deep vein thrombosis [DVT]).  People with an existing blood clot such as a pulmonary embolism. WARFARIN DOSING Warfarin tablets come in different strengths. Each tablet strength is a different color, with the amount of warfarin (in milligrams) clearly printed on the tablet. If the color of your tablet is different than usual when you receive a new prescription, report it immediately to your pharmacist or health care provider. WARFARIN MONITORING The goal of warfarin therapy is to lessen the clotting tendency of blood but not to prevent clotting completely. Your health care provider will monitor the anticoagulation effect of warfarin closely and adjust your dose as needed. For your safety, blood tests called prothrombin time (PT) or international  normalized ratio (INR) are used to measure the effects of warfarin. Both of these tests can be done with a finger stick or a blood draw. The longer it takes the blood to clot, the higher the PT or INR. Your health care provider will inform you of your "target" PT or INR range. If, at any time, your PT or INR is above the target range, there is a risk of bleeding. If your PT or INR is below the target range, there is a risk of clotting. Whether you are started on warfarin while you are in the hospital, or in your health care provider's office, you will need to have your PT or INR checked within one week of starting the medicine. Initially, some people are asked to have their PT or INR checked as much as twice a week. Once you are on a stable maintenance dose, the PT or INR is checked less often, usually once every 2 to 4 weeks. The warfarin dose may be adjusted if the PT or INR is not within the target range. It is important to keep all laboratory and health care provider follow-up appointments.  WHAT ARE THE SIDE EFFECTS OF WARFARIN?  Too much warfarin can cause bleeding (hemorrhage) from any part of the body. This may include bleeding from the gums, blood in the urine, bloody or dark stools, a nosebleed that is not easily stopped, coughing up blood, or vomiting blood.  Too little warfarin can increase the risk of blood clots.  Too little or too much warfarin can also increase the risk of a stroke.  Warfarin use may cause a skin rash or irritation, an unusual fever, continual nausea or stomach upset, or severe  pain in your joints or back. SPECIAL PRECAUTIONS WHILE TAKING WARFARIN Warfarin should be taken exactly as directed:  Take your medicine at the same time every day. If you forget to take your dose, you can take it if it is within 6 hours of when it was due.  Do not change the dose of warfarin on your own to make up for missed or extra doses.  If you miss more than 2 doses in a row, you  should contact your health care provider for advice. Avoid situations that cause bleeding. You may have a tendency to bleed more easily than usual while taking warfarin. The following actions can limit bleeding:  Using a softer toothbrush.  Flossing with waxed floss rather than unwaxed floss.  Shaving with an Neurosurgeon rather than a blade.  Limiting the use of sharp objects.  Avoiding potentially harmful activities such as contact sports. Warfarin and Pregnancy or Breastfeeding  Warfarin is not advised during the first trimester of pregnancy due to an increased risk of birth defects. In certain situations, a woman may take warfarin after her first trimester of pregnancy. A woman who becomes pregnant or plans to become pregnant while taking warfarin should notify her health care provider immediately.  Although warfarin does not pass into breast milk, a woman who wishes to breastfeed while taking warfarin should also consult with her health care provider. Alcohol, Smoking, and Illicit Drug Use  Alcohol affects how warfarin works in the body. It is best to avoid alcoholic drinks or consume very small amounts while taking warfarin. In general, alcohol intake should be limited to 1 oz (30 mL) of liquor, 6 oz (180 mL) of wine, or 12 oz (360 mL) of beer each day. Notify your health care provider if you change your alcohol intake.  Smoking affects how warfarin works. It is best to avoid smoking while taking warfarin. Notify your health care provider if you change your smoking habits.  It is best to avoid all illicit drugs while taking warfarin since there are few studies that show how warfarin interacts with these drugs. Other Medicines and Dietary Supplements Many prescription and over-the-counter medicines can interfere with warfarin. Be sure all of your health care providers know you are taking warfarin. Notify your health care provider who prescribed warfarin for you before starting or  stopping any new medicines, including over-the-counter vitamins, dietary supplements, and pain medicines. Your warfarin dose may need to be adjusted. Some common over-the-counter medicines that may increase the risk of bleeding while taking warfarin include:   Acetaminophen.  Aspirin.  Nonsteroidal anti-inflammatory medicines such as ibuprofen or naproxen.  Vitamin E. Dietary Considerations  Foods that have moderate or high amounts of vitamin K can interfere with warfarin. Avoid major changes in your diet or notify your health care provider before changing your diet. Eat a consistent amount of foods that have moderate or high amounts of vitamin K.Eating less foods containing vitamin K can increase the risk of bleeding. Eating more foods containing vitamin K can increase the risk of blood clots. Additional questions about dietary considerations can be discussed with a dietitian. The serving size for foods containing moderate or high amounts of vitamin K are  cup cooked (120 mL or noted gram weight) or 1 cup raw (240 mL or noted gram weight), unless otherwise noted. These foods include: Proteins  Beef liver, 3.5 oz (100 g).  Pork liver, 3.5 oz (100 g). Legumes  Soybean oil.  Soybeans.  Garbanzo beans.  Green peas.  Black-eyed peas. Leafy green vegetables  Kale.  Spinach.  Nettle greens.  Swiss chard.  Watercress.  Endive.  Parsley, 1 tbsp (4 g).  Turnip greens.  Collard greens.  Seaweed, limit 2 sheets.  Beet greens.  Dandelion greens.  Mustard greens.  Green Lead and Romaine lettuce. Cruciferous vegetables  Broccoli.  Cabbage (green or [place]).  Brussels sprouts.  Cauliflower.  Asparagus. Miscellaneous  Onions, green onions, or spring onions.  Green tea made with  oz (14 g) or more of dried tea.  Herbal teas containing coumarin.  Spinach noodles.  Okra.  Prunes.  Rosita Fire. CALL YOUR CLINIC OR HEALTH CARE PROVIDER IF YOU:  Plan to  have any surgery or procedure.  Feel sick, especially if you have diarrhea or vomiting.  Experience or anticipate any major changes in your diet.  Start or stop a prescription or over-the-counter medicine.  Become, plan to become, or think you may be pregnant.  Are having heavier than usual menstrual periods.  Have had a fall, accident, or any symptoms of bleeding or unusual bruising.  An unusual fever. CALL 911 IN THE [place] OR GO TO THE EMERGENCY DEPARTMENT IF YOU:   Think you may be having an allergic reaction to warfarin. The signs of an allergic reaction could include itching, rash, hives, swelling, chest tightness, or trouble breathing.  See signs of blood in your urine. The signs could include reddish, pinkish, or tea-colored urine.  See signs of blood in your stools. The signs could include bright red or black stools.  Vomit or cough up blood. In these instances, the blood could have either a bright red or a "coffee-grounds" appearance.  Have bleeding that will not stop after applying pressure for 30 minutes such as cuts, nosebleeds, other injuries.  Have severe pain in your joints or back.  Have a new and severe headache.  Have sudden weakness or numbness of your face, arm, or leg, especially on one side of your body.  Have sudden confusion or trouble understanding.  Have sudden trouble seeing in one or both eyes.  Have sudden trouble walking, dizziness, loss of balance, or coordination.  Have aphasia. Document Released: 02/12/2005 Document Revised: 11/07/2011 Document Reviewed: 08/08/2012 [facility] Patient Information 2014 [place], [place].

## 2013-03-10 NOTE — Progress Notes (Signed)
Pre visit review using our clinic review tool, if applicable. No additional management support is needed unless otherwise documented below in the visit note. 

## 2013-03-10 NOTE — Progress Notes (Signed)
Subjective:    Patient ID: Dominic Walters, male    DOB: 1970-08-09, 43 y.o.   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 He is doing well. INR was found to be 6.3 yesterday and work requires that he get an up-to-date DOT physical. He is being managed by hematology and is currently on Coumadin. Patient reports that he been taking 5 mg of Coumadin everyday and was not taking it according to the way he was scheduled to take it. Therefore, he took too much.   Review of Systems  Constitutional: Negative.   HENT: Negative.   Eyes: Negative.   Respiratory: Negative.   Cardiovascular: Negative.   Gastrointestinal: Negative.   Endocrine: Negative.   Genitourinary: Negative.   Musculoskeletal: Negative.   Skin: Negative.   Allergic/Immunologic: Negative.   Neurological: Negative.   Hematological: Negative.   Psychiatric/Behavioral: Negative.    Past Medical History  Diagnosis Date  . Hypertension     History   Social History  . Marital Status: Single    Spouse Name: N/A    Number of Children: N/A  . Years of Education: N/A   Occupational History  . Not on file.   Social History Main Topics  . Smoking status: Former Smoker -- 1.00 packs/day    Types: Cigarettes  . Smokeless tobacco: Not on file  . Alcohol Use: No  . Drug Use: No  . Sexual Activity: Not on file   Other Topics Concern  . Not on file   Social History Narrative  . No narrative on file    Past Surgical History  Procedure Laterality Date  . Carpal tunnel release      Family History  Problem Relation Age of Onset  . Arthritis Mother   . Hypertension Mother   . Cancer Father     lung  . Heart disease Father   . Diabetes Maternal Grandmother   . Hypertension Maternal Grandmother   . Heart disease Maternal Grandmother     No Known Allergies  Current  Outpatient Prescriptions on File Prior to Visit  Medication Sig Dispense Refill  . atorvastatin (LIPITOR) 40 MG tablet Take 40 mg by mouth daily.       . diazepam (VALIUM) 10 MG tablet Take 1 tablet (10 mg total) by mouth every 12 (twelve) hours as needed for anxiety.  30 tablet  1  . DULoxetine (CYMBALTA) 30 MG capsule Take 1 capsule (30 mg total) by mouth 2 (two) times daily.  60 capsule  3  . gabapentin (NEURONTIN) 400 MG capsule Take 1 capsule (400 mg total) by mouth 3 (three) times daily.  90 capsule  3  . lisinopril (PRINIVIL,ZESTRIL) 20 MG tablet Take 1 tablet (20 mg total) by mouth daily.  30 tablet  3  . oxyCODONE-acetaminophen (PERCOCET) 10-325 MG per tablet Take 1 tablet by mouth every 6 (six) hours as needed for pain.  120 tablet  0  . varenicline (CHANTIX STARTING MONTH PAK) 0.5 MG X 11 & 1 MG X 42 tablet Take one 0.5 mg tablet by mouth once daily for 3 days, then increase to one 0.5 mg tablet twice daily for 4 days, then increase to one 1 mg tablet twice daily.  53 tablet  2  . predniSONE (DELTASONE) 10 MG tablet Take 1 tablet (10 mg total) by mouth daily with breakfast. 40 mg x 1  week, 30 mg x 1 weeks, 20 mg x 1 week, 10mg  x 1 week  70 tablet  0   No current facility-administered medications on file prior to visit.    BP 122/84  Pulse 98  Ht 5\' 11"  (1.803 m)  Wt 286 lb (129.729 kg)  BMI 39.91 kg/m2chart    Objective:   Physical Exam  Constitutional: He is oriented to person, place, and time. He appears well-developed and well-nourished.  HENT:  Head: Normocephalic and atraumatic.  Right Ear: External ear normal.  Left Ear: External ear normal.  Nose: Nose normal.  Mouth/Throat: Oropharynx is clear and moist.  Eyes: Conjunctivae and EOM are normal. Pupils are equal, round, and reactive to light.  Neck: Normal range of motion. Neck supple. No thyromegaly present.  Cardiovascular: Normal rate, regular rhythm and normal heart sounds.   Pulmonary/Chest: Effort normal and  breath sounds normal.  Abdominal: Soft. Bowel sounds are normal.  Musculoskeletal: Normal range of motion. He exhibits no edema and no tenderness.  Neurological: He is alert and oriented to person, place, and time. He has normal reflexes. He displays normal reflexes. No cranial nerve deficit. Coordination normal.  Skin: Skin is warm and dry.  Psychiatric: He has a normal mood and affect.          Assessment & Plan: 1. Complete physical exam 2. Antiphospholipid syndrome 3.  Assessment: 1. CPX 2. antiphospholipid syndrome 3. History of CVA 4. Obesity 5. Hypertension  Plan: Follow hematology as scheduled. Increase intake of green leafy vegetables. Continue current medications but hold Coumadin. Call the office with any questions or concerns.

## 2013-03-11 ENCOUNTER — Telehealth: Payer: Self-pay | Admitting: Family

## 2013-03-11 NOTE — Telephone Encounter (Signed)
Relevant patient education assigned to patient using Emmi. ° °

## 2013-03-13 ENCOUNTER — Other Ambulatory Visit: Payer: Self-pay | Admitting: Family

## 2013-03-13 MED ORDER — PREDNISONE 10 MG PO TABS: 10.0000 mg | ORAL_TABLET | Freq: Every day | ORAL | Status: DC

## 2013-03-13 MED ORDER — OXYCODONE-ACETAMINOPHEN 10-325 MG PO TABS: 1.0000 | ORAL_TABLET | Freq: Four times a day (QID) | ORAL | Status: DC | PRN

## 2013-04-06 ENCOUNTER — Other Ambulatory Visit: Payer: Self-pay | Admitting: Family

## 2013-04-06 MED ORDER — PREDNISONE 10 MG PO TABS: 10.0000 mg | ORAL_TABLET | Freq: Every day | ORAL | Status: DC

## 2013-04-15 ENCOUNTER — Other Ambulatory Visit: Payer: Self-pay | Admitting: Family

## 2013-04-15 MED ORDER — OXYCODONE-ACETAMINOPHEN 10-325 MG PO TABS: 1.0000 | ORAL_TABLET | Freq: Four times a day (QID) | ORAL | Status: DC | PRN

## 2013-04-29 ENCOUNTER — Encounter: Payer: Self-pay | Admitting: Family

## 2013-04-29 ENCOUNTER — Ambulatory Visit (INDEPENDENT_AMBULATORY_CARE_PROVIDER_SITE_OTHER): Payer: BC Managed Care – PPO | Admitting: Family

## 2013-04-29 VITALS — BP 96/78 | HR 96 | Wt 286.0 lb

## 2013-04-29 DIAGNOSIS — M545 Low back pain, unspecified: Secondary | ICD-10-CM

## 2013-04-29 DIAGNOSIS — D6859 Other primary thrombophilia: Secondary | ICD-10-CM

## 2013-04-29 DIAGNOSIS — G8929 Other chronic pain: Secondary | ICD-10-CM

## 2013-04-29 DIAGNOSIS — M5412 Radiculopathy, cervical region: Secondary | ICD-10-CM

## 2013-04-29 DIAGNOSIS — Z7901 Long term (current) use of anticoagulants: Secondary | ICD-10-CM

## 2013-04-29 DIAGNOSIS — M792 Neuralgia and neuritis, unspecified: Secondary | ICD-10-CM

## 2013-04-29 DIAGNOSIS — I10 Essential (primary) hypertension: Secondary | ICD-10-CM

## 2013-04-29 DIAGNOSIS — D6861 Antiphospholipid syndrome: Secondary | ICD-10-CM

## 2013-04-29 MED ORDER — ATORVASTATIN CALCIUM 40 MG PO TABS
40.0000 mg | ORAL_TABLET | Freq: Every day | ORAL | Status: DC
Start: 2013-04-29 — End: 2013-08-25

## 2013-04-29 MED ORDER — PREGABALIN 75 MG PO CAPS: 75.0000 mg | ORAL_CAPSULE | Freq: Two times a day (BID) | ORAL | Status: DC

## 2013-04-29 NOTE — Progress Notes (Signed)
Pre visit review using our clinic review tool, if applicable. No additional management support is needed unless otherwise documented below in the visit note. 

## 2013-04-29 NOTE — Progress Notes (Signed)
Subjective:    Patient ID: Dominic Walters, male    DOB: [birth year], 43 y.o.   MRN: [id number]  HPI 43 year old white male, nonsmoker his in today for recheck of chronic low back pain, deep vein thrombosis on Coumadin being managed by [facility], and antiphospholipid antibody syndrome, and hyperlipidemia. He is currently under the care of the pain clinic and has been getting epidural injections have not been effective. He has discontinued Cymbalta because it made him tired. Has discontinued Neurontin because it made him loopy. Continues to struggle with low back pain that is worse at bedtime. Takes Percocet and prednisone in the morning that helps but wears off findings.   Review of Systems  Constitutional: Negative.   HENT: Negative.   Respiratory: Negative.   Cardiovascular: Negative.   Gastrointestinal: Negative.   Musculoskeletal: Positive for back pain.  Skin: Negative.   Neurological: Negative.   Hematological: Negative.   Psychiatric/Behavioral: Negative.    Past Medical History  Diagnosis Date  . Hypertension     History   Social History  . Marital Status: Single    Spouse Name: N/A    Number of Children: N/A  . Years of Education: N/A   Occupational History  . Not on file.   Social History Main Topics  . Smoking status: Former Smoker -- 1.00 packs/day    Types: Cigarettes  . Smokeless tobacco: Not on file  . Alcohol Use: No  . Drug Use: No  . Sexual Activity: Not on file   Other Topics Concern  . Not on file   Social History Narrative  . No narrative on file    Past Surgical History  Procedure Laterality Date  . Carpal tunnel release      Family History  Problem Relation Age of Onset  . Arthritis Mother   . Hypertension Mother   . Cancer Father     lung  . Heart disease Father   . Diabetes Maternal Grandmother   . Hypertension Maternal Grandmother   . Heart disease Maternal Grandmother     No Known Allergies  Current Outpatient  Prescriptions on File Prior to Visit  Medication Sig Dispense Refill  . diazepam (VALIUM) 10 MG tablet Take 1 tablet (10 mg total) by mouth every 12 (twelve) hours as needed for anxiety.  30 tablet  1  . DULoxetine (CYMBALTA) 30 MG capsule Take 1 capsule (30 mg total) by mouth 2 (two) times daily.  60 capsule  3  . lisinopril (PRINIVIL,ZESTRIL) 20 MG tablet Take 1 tablet (20 mg total) by mouth daily.  30 tablet  3  . oxyCODONE-acetaminophen (PERCOCET) 10-325 MG per tablet Take 1 tablet by mouth every 6 (six) hours as needed for pain.  120 tablet  0  . predniSONE (DELTASONE) 10 MG tablet Take 1 tablet (10 mg total) by mouth daily with breakfast. 40 mg x 1 week, 30 mg x 1 weeks, 20 mg x 1 week, 10mg  x 1 week  70 tablet  0  . varenicline (CHANTIX STARTING MONTH PAK) 0.5 MG X 11 & 1 MG X 42 tablet Take one 0.5 mg tablet by mouth once daily for 3 days, then increase to one 0.5 mg tablet twice daily for 4 days, then increase to one 1 mg tablet twice daily.  53 tablet  2   No current facility-administered medications on file prior to visit.    BP 96/78  Pulse 96  Wt 286 lb (129.729 kg)chart    Objective:  Physical Exam  Constitutional: He is oriented to person, place, and time. He appears well-developed and well-nourished.  Neck: Normal range of motion. Neck supple.  Cardiovascular: Normal rate, regular rhythm and normal heart sounds.   Pulmonary/Chest: Effort normal and breath sounds normal.  Abdominal: Bowel sounds are normal.  Musculoskeletal: He exhibits tenderness. He exhibits no edema.  Tenderness to palpation of the lower back  Neurological: He is alert and oriented to person, place, and time.  Skin: Skin is warm and dry.  Psychiatric: He has a normal mood and affect.          Assessment & Plan:  Dominic Walters was seen today for follow-up.  Diagnoses and associated orders for this visit:  Chronic low back pain  Chronic anticoagulation  Unspecified essential  hypertension  Antiphospholipid antibody syndrome  Other Orders - atorvastatin (LIPITOR) 40 MG tablet; Take 1 tablet (40 mg total) by mouth daily. - pregabalin (LYRICA) 75 MG capsule; Take 1 capsule (75 mg total) by mouth 2 (two) times daily.    call the office with any questions or concerns. Followup with the pain clinic as scheduled and he'll return in 4 months.

## 2013-04-29 NOTE — Patient Instructions (Signed)
Back Exercises These exercises may help you when beginning to rehabilitate your injury. Your symptoms may resolve with or without further involvement from your physician, physical therapist or athletic trainer. While completing these exercises, remember:   Restoring tissue flexibility helps normal motion to return to the joints. This allows healthier, less painful movement and activity.  An effective stretch should be held for at least 30 seconds.  A stretch should never be painful. You should only feel a gentle lengthening or release in the stretched tissue. STRETCH  Extension, Prone on Elbows   Lie on your stomach on the floor, a bed will be too soft. Place your palms about shoulder width apart and at the height of your head.  Place your elbows under your shoulders. If this is too painful, stack pillows under your chest.  Allow your body to relax so that your hips drop lower and make contact more completely with the floor.  Hold this position for __________ seconds.  Slowly return to lying flat on the floor. Repeat __________ times. Complete this exercise __________ times per day.  RANGE OF MOTION  Extension, Prone Press Ups   Lie on your stomach on the floor, a bed will be too soft. Place your palms about shoulder width apart and at the height of your head.  Keeping your back as relaxed as possible, slowly straighten your elbows while keeping your hips on the floor. You may adjust the placement of your hands to maximize your comfort. As you gain motion, your hands will come more underneath your shoulders.  Hold this position __________ seconds.  Slowly return to lying flat on the floor. Repeat __________ times. Complete this exercise __________ times per day.  RANGE OF MOTION- Quadruped, Neutral Spine   Assume a hands and knees position on a firm surface. Keep your hands under your shoulders and your knees under your hips. You may place padding under your knees for comfort.  Drop  your head and point your tail bone toward the ground below you. This will round out your low back like an angry cat. Hold this position for __________ seconds.  Slowly lift your head and release your tail bone so that your back sags into a large arch, like an old horse.  Hold this position for __________ seconds.  Repeat this until you feel limber in your low back.  Now, find your "sweet spot." This will be the most comfortable position somewhere between the two previous positions. This is your neutral spine. Once you have found this position, tense your stomach muscles to support your low back.  Hold this position for __________ seconds. Repeat __________ times. Complete this exercise __________ times per day.  STRETCH  Flexion, Single Knee to Chest   Lie on a firm bed or floor with both legs extended in front of you.  Keeping one leg in contact with the floor, bring your opposite knee to your chest. Hold your leg in place by either grabbing behind your thigh or at your knee.  Pull until you feel a gentle stretch in your low back. Hold __________ seconds.  Slowly release your grasp and repeat the exercise with the opposite side. Repeat __________ times. Complete this exercise __________ times per day.  STRETCH - Hamstrings, Standing  Stand or sit and extend your right / left leg, placing your foot on a chair or foot stool  Keeping a slight arch in your low back and your hips straight forward.  Lead with your chest and   lean forward at the waist until you feel a gentle stretch in the back of your right / left knee or thigh. (When done correctly, this exercise requires leaning only a small distance.)  Hold this position for __________ seconds. Repeat __________ times. Complete this stretch __________ times per day. STRENGTHENING  Deep Abdominals, Pelvic Tilt   Lie on a firm bed or floor. Keeping your legs in front of you, bend your knees so they are both pointed toward the ceiling and  your feet are flat on the floor.  Tense your lower abdominal muscles to press your low back into the floor. This motion will rotate your pelvis so that your tail bone is scooping upwards rather than pointing at your feet or into the floor.  With a gentle tension and even breathing, hold this position for __________ seconds. Repeat __________ times. Complete this exercise __________ times per day.  STRENGTHENING  Abdominals, Crunches   Lie on a firm bed or floor. Keeping your legs in front of you, bend your knees so they are both pointed toward the ceiling and your feet are flat on the floor. Cross your arms over your chest.  Slightly tip your chin down without bending your neck.  Tense your abdominals and slowly lift your trunk high enough to just clear your shoulder blades. Lifting higher can put excessive stress on the low back and does not further strengthen your abdominal muscles.  Control your return to the starting position. Repeat __________ times. Complete this exercise __________ times per day.  STRENGTHENING  Quadruped, Opposite UE/LE Lift   Assume a hands and knees position on a firm surface. Keep your hands under your shoulders and your knees under your hips. You may place padding under your knees for comfort.  Find your neutral spine and gently tense your abdominal muscles so that you can maintain this position. Your shoulders and hips should form a rectangle that is parallel with the floor and is not twisted.  Keeping your trunk steady, lift your right hand no higher than your shoulder and then your left leg no higher than your hip. Make sure you are not holding your breath. Hold this position __________ seconds.  Continuing to keep your abdominal muscles tense and your back steady, slowly return to your starting position. Repeat with the opposite arm and leg. Repeat __________ times. Complete this exercise __________ times per day. Document Released: 03/02/2005 Document  Revised: 05/07/2011 Document Reviewed: 05/27/2008 Exit[employer] Patient Information 2014 [employer].  

## 2013-05-15 ENCOUNTER — Other Ambulatory Visit: Payer: Self-pay | Admitting: Family

## 2013-05-15 MED ORDER — PREGABALIN 75 MG PO CAPS: 75.0000 mg | ORAL_CAPSULE | Freq: Two times a day (BID) | ORAL | Status: DC

## 2013-05-20 ENCOUNTER — Telehealth: Payer: Self-pay | Admitting: Family

## 2013-05-20 DIAGNOSIS — M792 Neuralgia and neuritis, unspecified: Secondary | ICD-10-CM | POA: Insufficient documentation

## 2013-05-25 ENCOUNTER — Emergency Department (HOSPITAL_BASED_OUTPATIENT_CLINIC_OR_DEPARTMENT_OTHER)
Admission: EM | Admit: 2013-05-25 | Discharge: 2013-05-25 | Disposition: A | Discharge status: Home / Self Care | Payer: BC Managed Care – PPO | Attending: Emergency Medicine | Admitting: Emergency Medicine

## 2013-05-25 ENCOUNTER — Emergency Department (HOSPITAL_BASED_OUTPATIENT_CLINIC_OR_DEPARTMENT_OTHER): Payer: BC Managed Care – PPO

## 2013-05-25 ENCOUNTER — Encounter (HOSPITAL_BASED_OUTPATIENT_CLINIC_OR_DEPARTMENT_OTHER): Payer: Self-pay | Admitting: Emergency Medicine

## 2013-05-25 DIAGNOSIS — G8929 Other chronic pain: Secondary | ICD-10-CM | POA: Insufficient documentation

## 2013-05-25 DIAGNOSIS — Z87891 Personal history of nicotine dependence: Secondary | ICD-10-CM | POA: Insufficient documentation

## 2013-05-25 DIAGNOSIS — Z7901 Long term (current) use of anticoagulants: Secondary | ICD-10-CM | POA: Insufficient documentation

## 2013-05-25 DIAGNOSIS — M1610 Unilateral primary osteoarthritis, unspecified hip: Secondary | ICD-10-CM | POA: Insufficient documentation

## 2013-05-25 DIAGNOSIS — Z79899 Other long term (current) drug therapy: Secondary | ICD-10-CM | POA: Insufficient documentation

## 2013-05-25 DIAGNOSIS — IMO0002 Reserved for concepts with insufficient information to code with codable children: Secondary | ICD-10-CM | POA: Insufficient documentation

## 2013-05-25 DIAGNOSIS — I10 Essential (primary) hypertension: Secondary | ICD-10-CM | POA: Insufficient documentation

## 2013-05-25 DIAGNOSIS — Z8673 Personal history of transient ischemic attack (TIA), and cerebral infarction without residual deficits: Secondary | ICD-10-CM | POA: Insufficient documentation

## 2013-05-25 DIAGNOSIS — Z7982 Long term (current) use of aspirin: Secondary | ICD-10-CM | POA: Insufficient documentation

## 2013-05-25 HISTORY — DX: Dorsalgia, unspecified: M54.9

## 2013-05-25 HISTORY — DX: Other chronic pain: G89.29

## 2013-05-25 HISTORY — DX: Cerebral infarction, unspecified: I63.9

## 2013-05-25 NOTE — ED Notes (Signed)
Patient states he has a long history of back and right hip pain. States over the last year, the pain has worsened, and is constant.  Radiation down entire right leg to knee.  History of herniated disk in the L3,4,5, which has recently been injected with epidural steriod injections.  States he would like to have an MRI.

## 2013-05-25 NOTE — ED Provider Notes (Signed)
CSN: [id number]     Arrival date & time 05/25/13  1025 History   First MD Initiated Contact with Patient 05/25/13 1053     Chief Complaint  Patient presents with  . Hip Pain     (Consider location/radiation/quality/duration/timing/severity/associated sxs/prior Treatment) HPI Comments: Pt states that he has been having an ongoing history of right hip pain:denies any injury. Is seeing pain management for disk and si problems  Patient is a 43 y.o. male presenting with hip pain. The history is provided by the patient. No language interpreter was used.  Hip Pain This is a new problem. The current episode started more than 1 month ago. The problem occurs constantly. The problem has been unchanged. Pertinent negatives include no fever. The symptoms are aggravated by walking. He has tried nothing for the symptoms.    Past Medical History  Diagnosis Date  . Hypertension   . Stroke   . Back pain, chronic     Herniated L 3, 4, 5.   Past Surgical History  Procedure Laterality Date  . Carpal tunnel release    . Thrombectomy      right arm  . Toe surgery     Family History  Problem Relation Age of Onset  . Arthritis Mother   . Hypertension Mother   . Cancer Father     lung  . Heart disease Father   . Diabetes Maternal Grandmother   . Hypertension Maternal Grandmother   . Heart disease Maternal Grandmother    History  Substance Use Topics  . Smoking status: Former Smoker -- 1.00 packs/day    Types: Cigarettes  . Smokeless tobacco: Not on file  . Alcohol Use: No    Review of Systems  Constitutional: Negative for fever.  Respiratory: Negative.   Cardiovascular: Negative.       Allergies  Ivp dye  Home Medications   Current Outpatient Rx  Name  Route  Sig  Dispense  Refill  . aspirin 81 MG tablet   Oral   Take 81 mg by mouth daily.         Marland Kitchen atorvastatin (LIPITOR) 40 MG tablet   Oral   Take 1 tablet (40 mg total) by mouth daily.   30 tablet   3   .  lisinopril (PRINIVIL,ZESTRIL) 20 MG tablet   Oral   Take 1 tablet (20 mg total) by mouth daily.   30 tablet   3   . oxymorphone (OPANA) 10 MG tablet   Oral   Take 10 mg by mouth every 6 (six) hours as needed for pain.         Marland Kitchen warfarin (COUMADIN) 2 MG tablet   Oral   Take 2 mg by mouth daily. 2 mg daily, Mon and Fri 3mg          . diazepam (VALIUM) 10 MG tablet   Oral   Take 1 tablet (10 mg total) by mouth every 12 (twelve) hours as needed for anxiety.   30 tablet   1   . DULoxetine (CYMBALTA) 30 MG capsule   Oral   Take 1 capsule (30 mg total) by mouth 2 (two) times daily.   60 capsule   3   . oxyCODONE-acetaminophen (PERCOCET) 10-325 MG per tablet   Oral   Take 1 tablet by mouth every 6 (six) hours as needed for pain.   120 tablet   0   . predniSONE (DELTASONE) 10 MG tablet   Oral   Take 1 tablet (  10 mg total) by mouth daily with breakfast. 40 mg x 1 week, 30 mg x 1 weeks, 20 mg x 1 week, 10mg  x 1 week   70 tablet   0   . pregabalin (LYRICA) 75 MG capsule   Oral   Take 1 capsule (75 mg total) by mouth 2 (two) times daily.   30 capsule   3   . varenicline (CHANTIX STARTING MONTH PAK) 0.5 MG X 11 & 1 MG X 42 tablet      Take one 0.5 mg tablet by mouth once daily for 3 days, then increase to one 0.5 mg tablet twice daily for 4 days, then increase to one 1 mg tablet twice daily.   53 tablet   2    BP 130/74  Pulse 75  Temp(Src) 97.6 F (36.4 C) (Oral)  Resp 18  Ht 5\' 11"  (1.803 m)  Wt 286 lb (129.729 kg)  BMI 39.91 kg/m2  SpO2 97% Physical Exam  Nursing note and vitals reviewed. Constitutional: He is oriented to person, place, and time. He appears well-developed and well-nourished.  Cardiovascular: Normal rate and regular rhythm.   Musculoskeletal: Normal range of motion.  No shortening or rotation noted:pt tender in the right lateral hip  Neurological: He is alert and oriented to person, place, and time.  Skin: Skin is warm and dry.  Psychiatric:  He has a normal mood and affect.    ED Course  Procedures (including critical care time) Labs Review Labs Reviewed - No data to display Imaging Review Dg Hip Complete Right  05/25/2013   CLINICAL DATA:  Chronic right hip pain  EXAM: RIGHT HIP - COMPLETE 2+ VIEW  COMPARISON:  None.  FINDINGS: Right hip intact. Normal alignment without fracture. Right hip degenerative arthritis noted with joint space loss superiorly. Bony spurring of the femoral head on the frogleg view. Pelvis intact. Normal SI joints. No diastases. Pelvic calcifications consistent with venous phleboliths.  IMPRESSION: Mild right hip degenerative arthritis.  No acute osseous finding.   Electronically Signed   By: Ruel Favorsrevor  Shick M.D.   On: 05/25/2013 11:17     EKG Interpretation None      MDM   Final diagnoses:  Hip arthritis    Pt already on pain medication. Will refer to Dr. Pearletha Forgehudnall for followup    [name], NP 05/25/13 1217

## 2013-05-25 NOTE — Discharge Instructions (Signed)
Arthritis, Nonspecific  Arthritis is pain, redness, warmth, or puffiness (inflammation) of a joint. The joint may be stiff or hurt when you move it. One or more joints may be affected. There are many types of arthritis. Your doctor may not know what type you have right away. The most common cause of arthritis is wear and tear on the joint (osteoarthritis).  HOME CARE   · Only take medicine as told by your doctor.  · Rest the joint as much as possible.  · Raise (elevate) your joint if it is puffy.  · Use crutches if the painful joint is in your leg.  · Drink enough fluids to keep your pee (urine) clear or pale yellow.  · Follow your doctor's diet instructions.  · Use cold packs for very bad joint pain for 10 to 15 minutes every hour. Ask your doctor if it is okay for you to use hot packs.  · Exercise as told by your doctor.  · Take a warm shower if you have stiffness in the morning.  · Move your sore joints throughout the day.  GET HELP RIGHT AWAY IF:   · You have a fever.  · You have very bad joint pain, puffiness, or redness.  · You have many joints that are painful and puffy.  · You are not getting better with treatment.  · You have very bad back pain or leg weakness.  · You cannot control when you poop (bowel movement) or pee (urinate).  · You do not feel better in 24 hours or are getting worse.  · You are having side effects from your medicine.  MAKE SURE YOU:   · Understand these instructions.  · Will watch your condition.  · Will get help right away if you are not doing well or get worse.  Document Released: 05/09/2009 Document Revised: 08/14/2011 Document Reviewed: 05/09/2009  [employer]® Patient Information ©2014 [employer].

## 2013-05-26 ENCOUNTER — Encounter: Payer: Self-pay | Admitting: Family Medicine

## 2013-05-26 ENCOUNTER — Ambulatory Visit (INDEPENDENT_AMBULATORY_CARE_PROVIDER_SITE_OTHER): Payer: BC Managed Care – PPO | Admitting: Family Medicine

## 2013-05-26 VITALS — BP 127/87 | HR 81 | Ht 72.0 in | Wt 286.0 lb

## 2013-05-26 DIAGNOSIS — M25559 Pain in unspecified hip: Secondary | ICD-10-CM

## 2013-05-26 DIAGNOSIS — M25551 Pain in right hip: Secondary | ICD-10-CM | POA: Insufficient documentation

## 2013-05-26 NOTE — Patient Instructions (Signed)
Your exam is consistent with intraarticular hip pathology - most likely a degenerative labral tear vs a severe flare of arthritis (though would expect more severe arthritis on your x-rays if this was the cause). Get the intraarticular cortisone injection as the next step. If you're still not improving with this give me a call (don't make an appointment) and we will go ahead with MR arthrogram of your hip, probable referral for arthroscopy. Take tylenol 500mg  1-2 tabs three times a day for pain. Glucosamine sulfate 750mg  twice a day is a supplement that may help. Capsaicin topically up to four times a day may also help with pain. Follow up with me as needed otherwise

## 2013-05-26 NOTE — Assessment & Plan Note (Signed)
patient's history and exam consistent with intraarticular hip pathology unrelated to his chronic back pain and disc bulges.  Severity of pain not consistent with the mild arthritis on his x-rays though is possible.  More likely with a degenerative labral tear.  Regardless would try intraarticular hip injection as next step - will try to arrange with Dr. Chari ManningBartko's office.  If not improving after 5-7 days would consider MR arthrogram as next step.  Tylenol, glucosamin, capsaicin, opana.  Offered physical therapy which may give some mild benefit - would start with injection first.

## 2013-05-26 NOTE — ED Provider Notes (Signed)
Medical screening examination/treatment/procedure(s) were performed by non-physician practitioner and as supervising physician I was immediately available for consultation/collaboration.   EKG Interpretation None        Nike Southers W. Jovante Hammitt, MD 05/26/13 0735 

## 2013-05-26 NOTE — Progress Notes (Addendum)
Patient ID: Dominic Walters, male   DOB: [birth year], 43 y.o.   MRN: [id number]  PCP: Janell QuietAMPBELL, PADONDA BOYD, FNP  Subjective:   HPI: Patient is a 43 y.o. male here for right hip pain.  Patient has history of chronic low back pain with known disc herniations at L3,4,5. Is under care of Dominic Walters for these, on opana and gets injections. Most recently had 2 ESIs and a right SI joint injection for current pain without relief. Current issues in right hip started 1-2 months ago. No known injury. 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 Pain felt posterior right hip and in groin. Pain radiates to anterior knee. Went to ED and told he had mild hip arthritis. Not tried any meds other than his opana. No bowel/bladder dysfunction.  Past Medical History  Diagnosis Date  . Hypertension   . Stroke   . Back pain, chronic     Herniated L 3, 4, 5.  . DVT (deep venous thrombosis)   . Antiphospholipid antibody syndrome     Current Outpatient Prescriptions on File Prior to Visit  Medication Sig Dispense Refill  . aspirin 81 MG tablet Take 81 mg by mouth daily.      Marland Kitchen. atorvastatin (LIPITOR) 40 MG tablet Take 1 tablet (40 mg total) by mouth daily.  30 tablet  3  . diazepam (VALIUM) 10 MG tablet Take 1 tablet (10 mg total) by mouth every 12 (twelve) hours as needed for anxiety.  30 tablet  1  . DULoxetine (CYMBALTA) 30 MG capsule Take 1 capsule (30 mg total) by mouth 2 (two) times daily.  60 capsule  3  . lisinopril (PRINIVIL,ZESTRIL) 20 MG tablet Take 1 tablet (20 mg total) by mouth daily.  30 tablet  3  . oxymorphone (OPANA) 10 MG tablet Take 10 mg by mouth every 6 (six) hours as needed for pain.      . pregabalin (LYRICA) 75 MG capsule Take 1 capsule (75 mg total) by mouth 2 (two) times daily.  30 capsule  3  . varenicline (CHANTIX STARTING MONTH PAK) 0.5 MG X 11 & 1 MG X 42 tablet Take one 0.5 mg tablet by mouth once daily for 3 days, then increase to one 0.5  mg tablet twice daily for 4 days, then increase to one 1 mg tablet twice daily.  53 tablet  2  . warfarin (COUMADIN) 2 MG tablet Take 2 mg by mouth daily. 2 mg daily, Mon and Fri 3mg        No current facility-administered medications on file prior to visit.    Past Surgical History  Procedure Laterality Date  . Carpal tunnel release    . Thrombectomy      right arm  . Toe surgery      Allergies  Allergen Reactions  . Vicodin [Hydrocodone-Acetaminophen]   . Ivp Dye [Iodinated Diagnostic Agents] Rash    History   Social History  . Marital Status: Single    Spouse Name: N/A    Number of Children: N/A  . Years of Education: N/A   Occupational History  . Not on file.   Social History Main Topics  . Smoking status: Current Every Day Smoker -- 0.50 packs/day    Types: Cigarettes  . Smokeless tobacco: Not on file  . Alcohol Use: No  . Drug Use: No  . Sexual Activity: Not on file   Other Topics Concern  . Not on  file   Social History Narrative  . No narrative on file    Family History  Problem Relation Age of Onset  . Arthritis Mother   . Hypertension Mother   . Diabetes Mother   . Hyperlipidemia Mother   . Cancer Father     lung  . Heart disease Father   . Diabetes Father   . Hyperlipidemia Father   . Hypertension Father   . Heart attack Father   . Diabetes Maternal Grandmother   . Hypertension Maternal Grandmother   . Heart disease Maternal Grandmother   . Hyperlipidemia Sister   . Hypertension Sister     BP 127/87  Pulse 81  Ht 6' (1.829 m)  Wt 286 lb (129.729 kg)  BMI 38.78 kg/m2  Review of Systems: See HPI above.    Objective:  Physical Exam:  Gen: NAD  Back/R hip: No gross deformity, scoliosis. No midline, paraspinal back TTP.  No trochanter TTP. FROM with pain on IR of hip.  No pain with back flexion/extension. Strength LEs 5/5 all muscle groups except 4/5 with right hip flexion, painful.   1+ MSRs in right patellar and achilles  tendons, 2+ on left Negative SLRs. Sensation intact to light touch bilaterally. Positive right hip logroll. Negative fabers - pain in groin on right.    Assessment & Plan:  1. Right hip pain - patient's history and exam consistent with intraarticular hip pathology unrelated to his chronic back pain and disc bulges.  Severity of pain not consistent with the mild arthritis on his x-rays though is possible.  More likely with a degenerative labral tear.  Regardless would try intraarticular hip injection as next step - will try to arrange with Dominic Walters office.  If not improving after 5-7 days would consider MR arthrogram as next step.  Tylenol, glucosamin, capsaicin, opana.  Offered physical therapy which may give some mild benefit - would start with injection first.  Addendum:  MRI arthrogram reviewed and discussed with patient.  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  Advised minimal weight bearing with crutches and will refer to orthopedics to discuss replacement, other measures.  He will let us know the name of the surgeon he would like to see.

## 2013-06-01 NOTE — Telephone Encounter (Signed)
error 

## 2013-06-04 ENCOUNTER — Telehealth: Payer: Self-pay | Admitting: Family Medicine

## 2013-06-04 ENCOUNTER — Telehealth: Payer: Self-pay

## 2013-06-04 NOTE — Telephone Encounter (Signed)
Ask if at this point he would like to move on to the next step, MRI arthrogram of hip.  Thanks!

## 2013-06-04 NOTE — Telephone Encounter (Signed)
Left message for pt to call back to discuss what the plan is from neurology due to continuous appeal processes for Lyrica

## 2013-06-05 ENCOUNTER — Other Ambulatory Visit: Payer: Self-pay | Admitting: Physical Medicine and Rehabilitation

## 2013-06-05 DIAGNOSIS — M25551 Pain in right hip: Secondary | ICD-10-CM

## 2013-06-08 ENCOUNTER — Encounter: Payer: Self-pay | Admitting: Family Medicine

## 2013-06-08 ENCOUNTER — Telehealth: Payer: Self-pay | Admitting: *Deleted

## 2013-06-08 DIAGNOSIS — M25551 Pain in right hip: Secondary | ICD-10-CM

## 2013-06-08 NOTE — Telephone Encounter (Signed)
Patient called to let us know that his MRI arthrogram is Wednesday. He also stated that the FMLA papers filled out stated that he was to go back today. He said that his work told him to get something in writing about his return to work is determined on the results of the MRI.

## 2013-06-08 NOTE — Telephone Encounter (Signed)
Will be out of work through 4/20 to allow time to review MRI, discuss next steps.

## 2013-06-08 NOTE — Telephone Encounter (Signed)
Several attempts made at calling pt and his wife to discuss Lyrica Rx. Will await pt's returned call or follow up appointment

## 2013-06-09 NOTE — Addendum Note (Signed)
Addended by: Lenda KelpHUDNALL, Kaedyn Polivka R on: 06/09/2013 04:26 PM   Modules accepted: Orders

## 2013-06-10 ENCOUNTER — Ambulatory Visit
Admission: RE | Admit: 2013-06-10 | Discharge: 2013-06-10 | Disposition: A | Discharge status: Home / Self Care | Payer: BC Managed Care – PPO | Source: Ambulatory Visit | Attending: Family Medicine | Admitting: Family Medicine

## 2013-06-10 ENCOUNTER — Other Ambulatory Visit: Payer: BC Managed Care – PPO

## 2013-06-10 DIAGNOSIS — M25551 Pain in right hip: Secondary | ICD-10-CM

## 2013-06-10 MED ORDER — IOHEXOL 180 MG/ML  SOLN: 15.0000 mL | Freq: Once | INTRAMUSCULAR | Status: AC | PRN

## 2013-06-12 ENCOUNTER — Other Ambulatory Visit: Payer: Self-pay | Admitting: Family

## 2013-06-12 MED ORDER — DIAZEPAM 10 MG PO TABS: 10.0000 mg | ORAL_TABLET | Freq: Two times a day (BID) | ORAL | Status: DC | PRN

## 2013-06-17 ENCOUNTER — Other Ambulatory Visit: Payer: Self-pay | Admitting: *Deleted

## 2013-06-17 DIAGNOSIS — M25551 Pain in right hip: Secondary | ICD-10-CM

## 2013-06-19 ENCOUNTER — Other Ambulatory Visit: Payer: Self-pay | Admitting: Family

## 2013-06-22 ENCOUNTER — Telehealth: Payer: Self-pay | Admitting: *Deleted

## 2013-06-22 NOTE — Telephone Encounter (Signed)
Dr. Murray HodgkinsBartko is in charge of pain medication.  His PCP would have to refer him to a different pain clinic if he is unhappy with his care there.

## 2013-06-22 NOTE — Telephone Encounter (Signed)
Patient called and stated that he is running out of pain medication, however the pain medication is not working. Stated that Murray HodgkinsBartko would not refill or prescribe anything for pain. Would like to be referred to a different pain clinic. [facility] July [facility], 2013 at 1:30 pm with Dr. Daphine DeutscherMartin.

## 2013-07-13 ENCOUNTER — Other Ambulatory Visit (HOSPITAL_COMMUNITY): Payer: Self-pay | Admitting: *Deleted

## 2013-07-13 ENCOUNTER — Encounter (HOSPITAL_COMMUNITY): Payer: Self-pay | Admitting: Pharmacy Technician

## 2013-07-13 ENCOUNTER — Other Ambulatory Visit: Payer: Self-pay | Admitting: Orthopedic Surgery

## 2013-07-13 NOTE — Pre-Procedure Instructions (Signed)
Dominic Walters  07/13/2013   Your procedure is scheduled on:  Friday, Jul 17, 2013. Please call [phone] at 8:00 AM day of surgery to get time of surgery and time of arrival (2 hours prior to surgery start time).   Report to [facility] Entrance "A" Admitting Office    Call this number if you have problems the morning of surgery: [phone]   Remember:   Do not eat food or drink liquids after midnight Thursday, 07/16/13.   Take these medicines the morning of surgery with A SIP OF WATER: diazepam (VALIUM) - if needed  Stop Vitamins and Aspirin as of today.    Do not wear jewelry.  Do not wear lotions, powders, or cologne. You may wear deodorant.  Men may shave face and neck.  Do not bring valuables to the hospital.  [facility] is not responsible                  for any belongings or valuables.               Contacts, dentures or bridgework may not be worn into surgery.  Leave suitcase in the car. After surgery it may be brought to your room.  For patients admitted to the hospital, discharge time is determined by your                treatment team.                Special Instructions: [place] - Preparing for Surgery  Before surgery, you can play an important role.  Because skin is not sterile, your skin needs to be as free of germs as possible.  You can reduce the number of germs on you skin by washing with CHG (chlorahexidine gluconate) soap before surgery.  CHG is an antiseptic cleaner which kills germs and bonds with the skin to continue killing germs even after washing.  Please DO NOT use if you have an allergy to CHG or antibacterial soaps.  If your skin becomes reddened/irritated stop using the CHG and inform your nurse when you arrive at Short Stay.  Do not shave (including legs and underarms) for at least 48 hours prior to the first CHG shower.  You may shave your face.  Please follow these instructions carefully:   1.  Shower with CHG Soap the night before  surgery and the                                morning of Surgery.  2.  If you choose to wash your hair, wash your hair first as usual with your       normal shampoo.  3.  After you shampoo, rinse your hair and body thoroughly to remove the                      Shampoo.  4.  Use CHG as you would any other liquid soap.  You can apply chg directly       to the skin and wash gently with scrungie or a clean washcloth.  5.  Apply the CHG Soap to your body ONLY FROM THE NECK DOWN.        Do not use on open wounds or open sores.  Avoid contact with your eyes, ears, mouth and genitals (private parts).  Wash genitals (private parts) with your normal soap.  6.  Wash thoroughly, paying special attention to the area where your surgery        will be performed.  7.  Thoroughly rinse your body with warm water from the neck down.  8.  DO NOT shower/wash with your normal soap after using and rinsing off       the CHG Soap.  9.  Pat yourself dry with a clean towel.            10.  Wear clean pajamas.            11.  Place clean sheets on your bed the night of your first shower and do not        sleep with pets.  Day of Surgery  Do not apply any lotions the morning of surgery.  Please wear clean clothes to the hospital/surgery center.     Please read over the following fact sheets that you were given: Pain Booklet, Coughing and Deep Breathing, Blood Transfusion Information, MRSA Information and Surgical Site Infection Prevention

## 2013-07-14 ENCOUNTER — Encounter (HOSPITAL_COMMUNITY)
Admission: RE | Admit: 2013-07-14 | Discharge: 2013-07-14 | Disposition: A | Discharge status: Home / Self Care | Payer: BC Managed Care – PPO | Source: Ambulatory Visit | Attending: Orthopedic Surgery | Admitting: Orthopedic Surgery

## 2013-07-14 ENCOUNTER — Encounter (HOSPITAL_COMMUNITY): Payer: Self-pay

## 2013-07-14 HISTORY — DX: Hematuria, unspecified: R31.9

## 2013-07-14 LAB — URINALYSIS, ROUTINE W REFLEX MICROSCOPIC
BILIRUBIN URINE: NEGATIVE
Glucose, UA: NEGATIVE mg/dL
Ketones, ur: NEGATIVE mg/dL
Leukocytes, UA: NEGATIVE
Nitrite: NEGATIVE
PH: 5 (ref 5.0–8.0)
Protein, ur: NEGATIVE mg/dL
SPECIFIC GRAVITY, URINE: 1.023 (ref 1.005–1.030)
UROBILINOGEN UA: 0.2 mg/dL (ref 0.0–1.0)

## 2013-07-14 LAB — PROTIME-INR
INR: 1.93 — AB (ref 0.00–1.49)
PROTHROMBIN TIME: 21.5 s — AB (ref 11.6–15.2)

## 2013-07-14 LAB — CBC WITH DIFFERENTIAL/PLATELET
Basophils Absolute: 0 10*3/uL (ref 0.0–0.1)
Basophils Relative: 0 % (ref 0–1)
Eosinophils Absolute: 0.3 10*3/uL (ref 0.0–0.7)
Eosinophils Relative: 3 % (ref 0–5)
HCT: 41.7 % (ref 39.0–52.0)
Hemoglobin: 13.9 g/dL (ref 13.0–17.0)
LYMPHS ABS: 2.2 10*3/uL (ref 0.7–4.0)
LYMPHS PCT: 23 % (ref 12–46)
MCH: 30.7 pg (ref 26.0–34.0)
MCHC: 33.3 g/dL (ref 30.0–36.0)
MCV: 92.1 fL (ref 78.0–100.0)
Monocytes Absolute: 0.5 10*3/uL (ref 0.1–1.0)
Monocytes Relative: 5 % (ref 3–12)
NEUTROS ABS: 6.3 10*3/uL (ref 1.7–7.7)
NEUTROS PCT: 69 % (ref 43–77)
PLATELETS: 252 10*3/uL (ref 150–400)
RBC: 4.53 MIL/uL (ref 4.22–5.81)
RDW: 13.8 % (ref 11.5–15.5)
WBC: 9.2 10*3/uL (ref 4.0–10.5)

## 2013-07-14 LAB — TYPE AND SCREEN
ABO/RH(D): A POS
ANTIBODY SCREEN: NEGATIVE

## 2013-07-14 LAB — URINE MICROSCOPIC-ADD ON

## 2013-07-14 LAB — BASIC METABOLIC PANEL
BUN: 16 mg/dL (ref 6–23)
CALCIUM: 9.5 mg/dL (ref 8.4–10.5)
CO2: 26 meq/L (ref 19–32)
Chloride: 104 mEq/L (ref 96–112)
Creatinine, Ser: 0.96 mg/dL (ref 0.50–1.35)
GFR calc Af Amer: >90 mL/min (ref >90)
GFR calc non Af Amer: >90 mL/min (ref >90)
Glucose, Bld: 78 mg/dL (ref 70–99)
Potassium: 4.3 mEq/L (ref 3.7–5.3)
Sodium: 144 mEq/L (ref 137–147)

## 2013-07-14 LAB — APTT: APTT: 42 s — AB (ref 24–37)

## 2013-07-14 LAB — SURGICAL PCR SCREEN
MRSA, PCR: NEGATIVE
STAPHYLOCOCCUS AUREUS: POSITIVE — AB

## 2013-07-14 LAB — ABO/RH: ABO/RH(D): A POS

## 2013-07-14 NOTE — Progress Notes (Addendum)
Pt gave ok to leave info with his wife or ok to leave a message. Pt wife called and informed of positive staph results and informed to pick up Script at [facility]. [facility] [place] [facility] [place]

## 2013-07-14 NOTE — Progress Notes (Signed)
07/14/13 0916  OBSTRUCTIVE SLEEP APNEA  Have you ever been diagnosed with sleep apnea through a sleep study? No  Do you snore loudly (loud enough to be heard through closed doors)?  0  Do you often feel tired, fatigued, or sleepy during the daytime? 0  Has anyone observed you stop breathing during your sleep? 0  Do you have, or are you being treated for high blood pressure? 1  BMI more than 35 kg/m2? 1  Age over 44 years old? 0  Neck circumference greater than 40 cm/16 inches? 1  Gender: 1  Obstructive Sleep Apnea Score 4  Score 4 or greater  Results sent to PCP

## 2013-07-14 NOTE — Progress Notes (Addendum)
PCP is Adline MangoPadonda Campbell, FNP Denies seeing a cardiologist Hemologist is Dr. Julio AlmSlatkoff and had instructed pt to start Lovenox (which he started last night) and stop coumadin  Denies having a recent CXR or EKG. Denies ever having a stress test or card cath [facility]. Pt and PTT abnormal order placed to repeat DOS

## 2013-07-15 NOTE — Progress Notes (Signed)
Anesthesia Chart Review:  Patient is a 43 year old male scheduled for right THA on 07/17/13 by Dr. Turner Danielsowan.  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 PCP is Jolaine ArtistPandonda Campbell, FNP.  Hematologist is Dr. Julio AlmSlatkoff who reportedly is having patient start a Lovenox bridge while off Coumadin for surgery.   EKG on 07/14/13 showed NSR.  Echo on 11/14/12 [facility] showed: Normal LV size, wall thickness, and wall motion with EF 60-65%. Trace MR, unable to estimate RVSP.  CXR on 07/14/13 showed: No acute cardiopulmonary abnormality seen.  Preoperative labs noted.  Repeat PT/PTT on arrival. If results are acceptable then I would anticipate that he could proceed as planned.  Velna Ochsllison Day Greb, PA-C [facility] Short Stay Center/Anesthesiology Phone [phone] 07/15/2013 11:35 AM

## 2013-07-16 DIAGNOSIS — M1611 Unilateral primary osteoarthritis, right hip: Secondary | ICD-10-CM | POA: Diagnosis present

## 2013-07-16 MED ORDER — DEXTROSE 5 % IV SOLN
3.0000 g | INTRAVENOUS | Status: AC
  Administered 2013-07-17: 3 g via INTRAVENOUS
  Filled 2013-07-16: qty 3000

## 2013-07-16 MED ORDER — CHLORHEXIDINE GLUCONATE 4 % EX LIQD
60.0000 mL | Freq: Once | CUTANEOUS | Status: DC
  Filled 2013-07-16: qty 60

## 2013-07-16 NOTE — Progress Notes (Signed)
Patient is aware to arrive at 0800 AM for surgery.

## 2013-07-16 NOTE — H&P (Signed)
TOTAL HIP ADMISSION H&P  Patient is admitted for right total hip arthroplasty.  Subjective:  Chief Complaint: right hip pain  HPI: Dominic Walters, 43 y.o. male, has a history of pain and functional disability in the right hip(s) due to arthritis and patient has failed non-surgical conservative treatments for greater than 12 weeks to include NSAID's and/or analgesics, flexibility and strengthening excercises, use of assistive devices and activity modification.  Onset of symptoms was abrupt starting 1 years ago with rapidlly worsening course since that time.The patient noted no past surgery on the right hip(s).  Patient currently rates pain in the right hip at 10 out of 10 with activity. Patient has night pain, worsening of pain with activity and weight bearing, pain that interfers with activities of daily living, pain with passive range of motion and crepitus. Patient has evidence of joint space narrowing by imaging studies. This condition presents safety issues increasing the risk of falls.   There is no current active infection.  Patient Active Problem List   Diagnosis Date Noted  . Right hip pain 05/26/2013  . Neuropathic pain, arm 05/20/2013  . Antiphospholipid antibody syndrome 03/10/2013  . 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 Blood clotting disorder 02/13/2013  . Chronic low back pain 12/16/2012  . Deep vein thrombosis, upper right extremity 12/16/2012  . High risk medication use 12/16/2012  . Anxiety state, unspecified 12/16/2012   Past Medical History  Diagnosis Date  . Hypertension   . Stroke   . Back pain, chronic     Herniated L 3, 4, 5.  . DVT (deep venous thrombosis)   . Antiphospholipid antibody syndrome   . Blood in urine     Past Surgical History  Procedure Laterality Date  . Carpal tunnel release    . Thrombectomy      right arm  . Toe surgery      No prescriptions prior to admission   Allergies  Allergen Reactions  . Vicodin  [Hydrocodone-Acetaminophen] Itching  . Ivp Dye [Iodinated Diagnostic Agents] Rash    History  Substance Use Topics  . Smoking status: Current Every Day Smoker -- 0.50 packs/day for 25 years    Types: Cigarettes  . Smokeless tobacco: Not on file  . Alcohol Use: No    Family History  Problem Relation Age of Onset  . Arthritis Mother   . Hypertension Mother   . Diabetes Mother   . Hyperlipidemia Mother   . Cancer Father     lung  . Heart disease Father   . Diabetes Father   . Hyperlipidemia Father   . Hypertension Father   . Heart attack Father   . Diabetes Maternal Grandmother   . Hypertension Maternal Grandmother   . Heart disease Maternal Grandmother   . Hyperlipidemia Sister   . Hypertension Sister      Review of Systems  Constitutional: Negative.   HENT: Negative.   Eyes: Negative.   Respiratory: Negative.   Cardiovascular: Negative.   Gastrointestinal: Negative.   Genitourinary: Negative.   Musculoskeletal: Positive for joint pain.  Skin: Negative.   Neurological: Negative.   Endo/Heme/Allergies: Bruises/bleeds easily.       Antiphospholipid syndrome.  Currently on coumadin  Psychiatric/Behavioral: Negative.     Objective:  Physical Exam  Constitutional: He is oriented to person, place, and time. He appears well-developed and well-nourished.  HENT:  Head: Normocephalic and atraumatic.  Eyes: Pupils are equal, round, and reactive to light.  Neck: Normal range of motion.  Neck supple.  Cardiovascular: Intact distal pulses.   Respiratory: Effort normal.  Musculoskeletal:  Patient walks with a highly antalgic gait on the right side.  Any attempts at internal rotation.  Her right hip causes severe pain and he is tender somewhat over the greater trochanter.  Foot tap is negative.  Normal sensation to his feet.  Normal pulses to his feet.    Neurological: He is alert and oriented to person, place, and time.  Skin: Skin is warm and dry.  Psychiatric: He has a  normal mood and affect. His behavior is normal. Judgment and thought content normal.    Vital signs in last 24 hours:    Labs:   Estimated body mass index is 38.78 kg/(m^2) as calculated from the following:   Height as of 05/26/13: 6' (1.829 m).   Weight as of 05/26/13: 129.729 kg (286 lb).   Imaging Review Plain radiographs demonstrate development of hip dysplasia with a neck-shaft angle 150 and a shallow acetabulum and changes consistent with avascular necrosis.  Assessment/Plan:  End stage arthritis, right hip(s)  The patient history, physical examination, clinical judgement of the provider and imaging studies are consistent with end stage degenerative joint disease of the right hip(s) and total hip arthroplasty is deemed medically necessary. The treatment options including medical management, injection therapy, arthroscopy and arthroplasty were discussed at length. The risks and benefits of total hip arthroplasty were presented and reviewed. The risks due to aseptic loosening, infection, stiffness, dislocation/subluxation,  thromboembolic complications and other imponderables were discussed.  The patient acknowledged the explanation, agreed to proceed with the plan and consent was signed. Patient is being admitted for inpatient treatment for surgery, pain control, PT, OT, prophylactic antibiotics, VTE prophylaxis, progressive ambulation and ADL's and discharge planning.The patient is planning to be discharged home with home health services

## 2013-07-17 ENCOUNTER — Inpatient Hospital Stay (HOSPITAL_COMMUNITY): Payer: BC Managed Care – PPO

## 2013-07-17 ENCOUNTER — Ambulatory Visit (HOSPITAL_COMMUNITY): Payer: BC Managed Care – PPO | Admitting: Anesthesiology

## 2013-07-17 ENCOUNTER — Encounter (HOSPITAL_COMMUNITY): Payer: BC Managed Care – PPO | Admitting: Vascular Surgery

## 2013-07-17 ENCOUNTER — Inpatient Hospital Stay (HOSPITAL_COMMUNITY)
Admission: RE | Admit: 2013-07-17 | Discharge: 2013-07-19 | DRG: 470 | Disposition: A | Discharge status: Home Health | Payer: BC Managed Care – PPO | Source: Ambulatory Visit | Attending: Orthopedic Surgery | Admitting: Orthopedic Surgery

## 2013-07-17 ENCOUNTER — Encounter (HOSPITAL_COMMUNITY)
Admission: RE | Disposition: A | Discharge status: Home Health | Payer: Self-pay | Source: Ambulatory Visit | Attending: Orthopedic Surgery

## 2013-07-17 ENCOUNTER — Encounter (HOSPITAL_COMMUNITY): Payer: Self-pay | Admitting: Anesthesiology

## 2013-07-17 DIAGNOSIS — M545 Low back pain, unspecified: Secondary | ICD-10-CM | POA: Diagnosis present

## 2013-07-17 DIAGNOSIS — G5690 Unspecified mononeuropathy of unspecified upper limb: Secondary | ICD-10-CM | POA: Diagnosis present

## 2013-07-17 DIAGNOSIS — M1611 Unilateral primary osteoarthritis, right hip: Secondary | ICD-10-CM

## 2013-07-17 DIAGNOSIS — F17200 Nicotine dependence, unspecified, uncomplicated: Secondary | ICD-10-CM | POA: Diagnosis present

## 2013-07-17 DIAGNOSIS — M1610 Unilateral primary osteoarthritis, unspecified hip: Principal | ICD-10-CM | POA: Diagnosis present

## 2013-07-17 DIAGNOSIS — I10 Essential (primary) hypertension: Secondary | ICD-10-CM | POA: Diagnosis present

## 2013-07-17 DIAGNOSIS — M87059 Idiopathic aseptic necrosis of unspecified femur: Secondary | ICD-10-CM | POA: Diagnosis present

## 2013-07-17 DIAGNOSIS — F411 Generalized anxiety disorder: Secondary | ICD-10-CM | POA: Diagnosis present

## 2013-07-17 DIAGNOSIS — D689 Coagulation defect, unspecified: Secondary | ICD-10-CM | POA: Diagnosis present

## 2013-07-17 DIAGNOSIS — Q6589 Other specified congenital deformities of hip: Secondary | ICD-10-CM

## 2013-07-17 DIAGNOSIS — M169 Osteoarthritis of hip, unspecified: Principal | ICD-10-CM | POA: Diagnosis present

## 2013-07-17 DIAGNOSIS — Z86718 Personal history of other venous thrombosis and embolism: Secondary | ICD-10-CM

## 2013-07-17 DIAGNOSIS — Z8673 Personal history of transient ischemic attack (TIA), and cerebral infarction without residual deficits: Secondary | ICD-10-CM

## 2013-07-17 DIAGNOSIS — G8929 Other chronic pain: Secondary | ICD-10-CM | POA: Diagnosis present

## 2013-07-17 DIAGNOSIS — Z6835 Body mass index (BMI) 35.0-35.9, adult: Secondary | ICD-10-CM

## 2013-07-17 DIAGNOSIS — D6859 Other primary thrombophilia: Secondary | ICD-10-CM | POA: Diagnosis present

## 2013-07-17 HISTORY — PX: TOTAL HIP ARTHROPLASTY: SHX124

## 2013-07-17 LAB — PROTIME-INR
INR: 1.1 (ref 0.00–1.49)
Prothrombin Time: 14 seconds (ref 11.6–15.2)

## 2013-07-17 LAB — APTT: aPTT: 32 seconds (ref 24–37)

## 2013-07-17 SURGERY — ARTHROPLASTY, HIP, TOTAL,POSTERIOR APPROACH
Anesthesia: General | Site: Hip | Laterality: Right

## 2013-07-17 MED ORDER — METHOCARBAMOL 500 MG PO TABS: 500.0000 mg | ORAL_TABLET | Freq: Two times a day (BID) | ORAL | Status: DC

## 2013-07-17 MED ORDER — PROPOFOL 10 MG/ML IV BOLUS
INTRAVENOUS | Status: AC
  Filled 2013-07-17: qty 20

## 2013-07-17 MED ORDER — BISACODYL 5 MG PO TBEC: 5.0000 mg | DELAYED_RELEASE_TABLET | Freq: Every day | ORAL | Status: DC | PRN

## 2013-07-17 MED ORDER — DOCUSATE SODIUM 100 MG PO CAPS
100.0000 mg | ORAL_CAPSULE | Freq: Two times a day (BID) | ORAL | Status: DC
  Administered 2013-07-17 – 2013-07-19 (×4): 100 mg via ORAL
  Filled 2013-07-17 (×5): qty 1

## 2013-07-17 MED ORDER — GLYCOPYRROLATE 0.2 MG/ML IJ SOLN
INTRAMUSCULAR | Status: DC | PRN
  Administered 2013-07-17: 0.4 mg via INTRAVENOUS

## 2013-07-17 MED ORDER — ENOXAPARIN SODIUM 60 MG/0.6ML ~~LOC~~ SOLN
60.0000 mg | Freq: Once | SUBCUTANEOUS | Status: AC
  Administered 2013-07-17: 60 mg via SUBCUTANEOUS
  Filled 2013-07-17: qty 0.6

## 2013-07-17 MED ORDER — MAGNESIUM CITRATE PO SOLN: 1.0000 | Freq: Once | ORAL | Status: AC | PRN

## 2013-07-17 MED ORDER — OXYCODONE HCL 5 MG PO TABS
5.0000 mg | ORAL_TABLET | ORAL | Status: DC | PRN
Start: 2013-07-17 — End: 2013-07-19
  Administered 2013-07-17 – 2013-07-19 (×12): 10 mg via ORAL
  Filled 2013-07-17 (×12): qty 2

## 2013-07-17 MED ORDER — FENTANYL CITRATE 0.05 MG/ML IJ SOLN
INTRAMUSCULAR | Status: AC
  Filled 2013-07-17: qty 5

## 2013-07-17 MED ORDER — MIDAZOLAM HCL 2 MG/2ML IJ SOLN
INTRAMUSCULAR | Status: AC
  Filled 2013-07-17: qty 2

## 2013-07-17 MED ORDER — ACETAMINOPHEN 325 MG PO TABS
650.0000 mg | ORAL_TABLET | Freq: Four times a day (QID) | ORAL | Status: DC | PRN
  Administered 2013-07-19: 650 mg via ORAL
  Filled 2013-07-17: qty 2

## 2013-07-17 MED ORDER — METHOCARBAMOL 1000 MG/10ML IJ SOLN: 500.0000 mg | Freq: Four times a day (QID) | INTRAVENOUS | Status: DC | PRN

## 2013-07-17 MED ORDER — HYDROMORPHONE HCL PF 1 MG/ML IJ SOLN
INTRAMUSCULAR | Status: AC
  Filled 2013-07-17: qty 1

## 2013-07-17 MED ORDER — PROPOFOL 10 MG/ML IV BOLUS
INTRAVENOUS | Status: DC | PRN
  Administered 2013-07-17: 400 mg via INTRAVENOUS

## 2013-07-17 MED ORDER — SODIUM CHLORIDE 0.9 % IR SOLN
Status: DC | PRN
  Administered 2013-07-17: 1000 mL

## 2013-07-17 MED ORDER — ONDANSETRON HCL 4 MG/2ML IJ SOLN
INTRAMUSCULAR | Status: AC
  Filled 2013-07-17: qty 2

## 2013-07-17 MED ORDER — METHOCARBAMOL 500 MG PO TABS
500.0000 mg | ORAL_TABLET | Freq: Four times a day (QID) | ORAL | Status: DC | PRN
  Administered 2013-07-17 – 2013-07-19 (×7): 500 mg via ORAL
  Filled 2013-07-17 (×7): qty 1

## 2013-07-17 MED ORDER — MENTHOL 3 MG MT LOZG: 1.0000 | LOZENGE | OROMUCOSAL | Status: DC | PRN

## 2013-07-17 MED ORDER — METOCLOPRAMIDE HCL 5 MG/ML IJ SOLN: 5.0000 mg | Freq: Three times a day (TID) | INTRAMUSCULAR | Status: DC | PRN

## 2013-07-17 MED ORDER — ONDANSETRON HCL 4 MG/2ML IJ SOLN: 4.0000 mg | Freq: Four times a day (QID) | INTRAMUSCULAR | Status: DC | PRN

## 2013-07-17 MED ORDER — METOCLOPRAMIDE HCL 5 MG PO TABS
5.0000 mg | ORAL_TABLET | Freq: Three times a day (TID) | ORAL | Status: DC | PRN
  Filled 2013-07-17: qty 2

## 2013-07-17 MED ORDER — KCL IN DEXTROSE-NACL 20-5-0.45 MEQ/L-%-% IV SOLN
INTRAVENOUS | Status: DC
  Filled 2013-07-17 (×9): qty 1000

## 2013-07-17 MED ORDER — HYDROMORPHONE HCL PF 1 MG/ML IJ SOLN
INTRAMUSCULAR | Status: AC
  Filled 2013-07-17: qty 2

## 2013-07-17 MED ORDER — BUPIVACAINE-EPINEPHRINE (PF) 0.5% -1:200000 IJ SOLN
INTRAMUSCULAR | Status: AC
  Filled 2013-07-17: qty 30

## 2013-07-17 MED ORDER — LACTATED RINGERS IV SOLN
INTRAVENOUS | Status: DC
  Administered 2013-07-17: 09:00:00 via INTRAVENOUS

## 2013-07-17 MED ORDER — PHENYLEPHRINE 40 MCG/ML (10ML) SYRINGE FOR IV PUSH (FOR BLOOD PRESSURE SUPPORT)
PREFILLED_SYRINGE | INTRAVENOUS | Status: AC
  Filled 2013-07-17: qty 10

## 2013-07-17 MED ORDER — HYDROMORPHONE HCL PF 1 MG/ML IJ SOLN
1.0000 mg | INTRAMUSCULAR | Status: DC | PRN
  Administered 2013-07-17 – 2013-07-19 (×7): 1 mg via INTRAVENOUS
  Filled 2013-07-17 (×8): qty 1

## 2013-07-17 MED ORDER — ONDANSETRON HCL 4 MG/2ML IJ SOLN: 4.0000 mg | Freq: Once | INTRAMUSCULAR | Status: DC | PRN

## 2013-07-17 MED ORDER — NEOSTIGMINE METHYLSULFATE 10 MG/10ML IV SOLN
INTRAVENOUS | Status: DC | PRN
  Administered 2013-07-17: 3 mg via INTRAVENOUS

## 2013-07-17 MED ORDER — LACTATED RINGERS IV SOLN
INTRAVENOUS | Status: DC | PRN
  Administered 2013-07-17 (×2): via INTRAVENOUS

## 2013-07-17 MED ORDER — HYDROMORPHONE HCL PF 1 MG/ML IJ SOLN
0.2500 mg | INTRAMUSCULAR | Status: DC | PRN
  Administered 2013-07-17: 1 mg via INTRAVENOUS
  Administered 2013-07-17 (×3): 0.5 mg via INTRAVENOUS
  Administered 2013-07-17: 1 mg via INTRAVENOUS
  Administered 2013-07-17: 0.5 mg via INTRAVENOUS

## 2013-07-17 MED ORDER — ENOXAPARIN SODIUM 120 MG/0.8ML ~~LOC~~ SOLN
120.0000 mg | Freq: Two times a day (BID) | SUBCUTANEOUS | Status: DC
  Administered 2013-07-18 – 2013-07-19 (×3): 120 mg via SUBCUTANEOUS
  Filled 2013-07-17 (×5): qty 0.8

## 2013-07-17 MED ORDER — LIDOCAINE HCL (CARDIAC) 20 MG/ML IV SOLN
INTRAVENOUS | Status: AC
  Filled 2013-07-17: qty 5

## 2013-07-17 MED ORDER — OXYCODONE HCL 5 MG PO TABS: 5.0000 mg | ORAL_TABLET | Freq: Once | ORAL | Status: DC | PRN

## 2013-07-17 MED ORDER — GLYCOPYRROLATE 0.2 MG/ML IJ SOLN
INTRAMUSCULAR | Status: AC
  Filled 2013-07-17: qty 2

## 2013-07-17 MED ORDER — ENOXAPARIN SODIUM 120 MG/0.8ML ~~LOC~~ SOLN: 120.0000 mg | Freq: Two times a day (BID) | SUBCUTANEOUS | Status: DC

## 2013-07-17 MED ORDER — SENNOSIDES-DOCUSATE SODIUM 8.6-50 MG PO TABS: 1.0000 | ORAL_TABLET | Freq: Every evening | ORAL | Status: DC | PRN

## 2013-07-17 MED ORDER — ROCURONIUM BROMIDE 50 MG/5ML IV SOLN
INTRAVENOUS | Status: AC
  Filled 2013-07-17: qty 1

## 2013-07-17 MED ORDER — DEXTROSE-NACL 5-0.45 % IV SOLN: INTRAVENOUS | Status: DC

## 2013-07-17 MED ORDER — NEOSTIGMINE METHYLSULFATE 10 MG/10ML IV SOLN
INTRAVENOUS | Status: AC
  Filled 2013-07-17: qty 1

## 2013-07-17 MED ORDER — PHENOL 1.4 % MT LIQD: 1.0000 | OROMUCOSAL | Status: DC | PRN

## 2013-07-17 MED ORDER — ONDANSETRON HCL 4 MG/2ML IJ SOLN
INTRAMUSCULAR | Status: DC | PRN
  Administered 2013-07-17: 4 mg via INTRAVENOUS

## 2013-07-17 MED ORDER — ONDANSETRON HCL 4 MG PO TABS: 4.0000 mg | ORAL_TABLET | Freq: Four times a day (QID) | ORAL | Status: DC | PRN

## 2013-07-17 MED ORDER — OXYCODONE-ACETAMINOPHEN 5-325 MG PO TABS: 1.0000 | ORAL_TABLET | ORAL | Status: DC | PRN

## 2013-07-17 MED ORDER — ROCURONIUM BROMIDE 100 MG/10ML IV SOLN
INTRAVENOUS | Status: DC | PRN
  Administered 2013-07-17: 50 mg via INTRAVENOUS

## 2013-07-17 MED ORDER — ACETAMINOPHEN 650 MG RE SUPP: 650.0000 mg | Freq: Four times a day (QID) | RECTAL | Status: DC | PRN

## 2013-07-17 MED ORDER — LISINOPRIL 20 MG PO TABS
20.0000 mg | ORAL_TABLET | Freq: Every day | ORAL | Status: DC
  Administered 2013-07-17 – 2013-07-19 (×3): 20 mg via ORAL
  Filled 2013-07-17 (×3): qty 1

## 2013-07-17 MED ORDER — DIPHENHYDRAMINE HCL 12.5 MG/5ML PO ELIX
12.5000 mg | ORAL_SOLUTION | ORAL | Status: DC | PRN
  Administered 2013-07-17: 25 mg via ORAL
  Filled 2013-07-17: qty 10

## 2013-07-17 MED ORDER — LIDOCAINE HCL (CARDIAC) 20 MG/ML IV SOLN
INTRAVENOUS | Status: DC | PRN
  Administered 2013-07-17: 90 mg via INTRAVENOUS

## 2013-07-17 MED ORDER — DIAZEPAM 5 MG PO TABS
10.0000 mg | ORAL_TABLET | Freq: Two times a day (BID) | ORAL | Status: DC | PRN
  Administered 2013-07-17 – 2013-07-18 (×2): 10 mg via ORAL
  Filled 2013-07-17 (×2): qty 2

## 2013-07-17 MED ORDER — PHENYLEPHRINE HCL 10 MG/ML IJ SOLN
INTRAMUSCULAR | Status: DC | PRN
  Administered 2013-07-17 (×3): 80 ug via INTRAVENOUS

## 2013-07-17 MED ORDER — MIDAZOLAM HCL 5 MG/5ML IJ SOLN
INTRAMUSCULAR | Status: DC | PRN
  Administered 2013-07-17: 2 mg via INTRAVENOUS

## 2013-07-17 MED ORDER — HYDROMORPHONE HCL 2 MG PO TABS
2.0000 mg | ORAL_TABLET | ORAL | Status: DC | PRN
  Administered 2013-07-17 – 2013-07-18 (×3): 2 mg via ORAL
  Filled 2013-07-17 (×3): qty 1

## 2013-07-17 MED ORDER — BUPIVACAINE-EPINEPHRINE 0.5% -1:200000 IJ SOLN
INTRAMUSCULAR | Status: DC | PRN
  Administered 2013-07-17: 20 mL

## 2013-07-17 MED ORDER — FENTANYL CITRATE 0.05 MG/ML IJ SOLN
INTRAMUSCULAR | Status: DC | PRN
  Administered 2013-07-17 (×2): 100 ug via INTRAVENOUS
  Administered 2013-07-17: 150 ug via INTRAVENOUS

## 2013-07-17 MED ORDER — OXYCODONE HCL 5 MG/5ML PO SOLN: 5.0000 mg | Freq: Once | ORAL | Status: DC | PRN

## 2013-07-17 SURGICAL SUPPLY — 60 items
BLADE SAW SGTL 18X1.27X75 (BLADE) ×2 IMPLANT
BLADE SAW SGTL 18X1.27X75MM (BLADE) ×1
BLADE SURG 10 STRL SS (BLADE) ×2 IMPLANT
BRUSH FEMORAL CANAL (MISCELLANEOUS) IMPLANT
CAPT HIP PF COP ×2 IMPLANT
COVER BACK TABLE 24X17X13 BIG (DRAPES) IMPLANT
COVER SURGICAL LIGHT HANDLE (MISCELLANEOUS) ×4 IMPLANT
DRAPE ORTHO SPLIT 77X108 STRL (DRAPES) ×3
DRAPE PROXIMA HALF (DRAPES) ×3 IMPLANT
DRAPE SURG ORHT 6 SPLT 77X108 (DRAPES) ×1 IMPLANT
DRAPE U-SHAPE 47X51 STRL (DRAPES) ×3 IMPLANT
DRILL BIT 7/64X5 (BIT) ×3 IMPLANT
DRSG AQUACEL AG ADV 3.5X10 (GAUZE/BANDAGES/DRESSINGS) ×3 IMPLANT
DURAPREP 26ML APPLICATOR (WOUND CARE) ×3 IMPLANT
ELECT BLADE 4.0 EZ CLEAN MEGAD (MISCELLANEOUS)
ELECT REM PT RETURN 9FT ADLT (ELECTROSURGICAL) ×3
ELECTRODE BLDE 4.0 EZ CLN MEGD (MISCELLANEOUS) IMPLANT
ELECTRODE REM PT RTRN 9FT ADLT (ELECTROSURGICAL) ×1 IMPLANT
GAUZE XEROFORM 1X8 LF (GAUZE/BANDAGES/DRESSINGS) ×3 IMPLANT
GLOVE BIO SURGEON STRL SZ 6.5 (GLOVE) ×1 IMPLANT
GLOVE BIO SURGEON STRL SZ7.5 (GLOVE) ×3 IMPLANT
GLOVE BIO SURGEON STRL SZ8.5 (GLOVE) ×6 IMPLANT
GLOVE BIO SURGEONS STRL SZ 6.5 (GLOVE) ×1
GLOVE BIOGEL PI IND STRL 6.5 (GLOVE) IMPLANT
GLOVE BIOGEL PI IND STRL 7.0 (GLOVE) IMPLANT
GLOVE BIOGEL PI IND STRL 8 (GLOVE) ×2 IMPLANT
GLOVE BIOGEL PI IND STRL 9 (GLOVE) ×1 IMPLANT
GLOVE BIOGEL PI INDICATOR 6.5 (GLOVE) ×2
GLOVE BIOGEL PI INDICATOR 7.0 (GLOVE) ×4
GLOVE BIOGEL PI INDICATOR 8 (GLOVE) ×4
GLOVE BIOGEL PI INDICATOR 9 (GLOVE) ×2
GOWN STRL REUS W/ TWL LRG LVL3 (GOWN DISPOSABLE) ×2 IMPLANT
GOWN STRL REUS W/ TWL XL LVL3 (GOWN DISPOSABLE) ×3 IMPLANT
GOWN STRL REUS W/TWL LRG LVL3 (GOWN DISPOSABLE) ×3
GOWN STRL REUS W/TWL XL LVL3 (GOWN DISPOSABLE) ×6
HANDPIECE INTERPULSE COAX TIP (DISPOSABLE)
HOOD PEEL AWAY FACE SHEILD DIS (HOOD) ×6 IMPLANT
KIT BASIN OR (CUSTOM PROCEDURE TRAY) ×3 IMPLANT
KIT ROOM TURNOVER OR (KITS) ×3 IMPLANT
MANIFOLD NEPTUNE II (INSTRUMENTS) ×1 IMPLANT
NEEDLE 22X1 1/2 (OR ONLY) (NEEDLE) ×3 IMPLANT
NS IRRIG 1000ML POUR BTL (IV SOLUTION) ×3 IMPLANT
PACK TOTAL JOINT (CUSTOM PROCEDURE TRAY) ×3 IMPLANT
PAD ARMBOARD 7.5X6 YLW CONV (MISCELLANEOUS) ×6 IMPLANT
PASSER SUT SWANSON 36MM LOOP (INSTRUMENTS) ×3 IMPLANT
PRESSURIZER FEMORAL UNIV (MISCELLANEOUS) IMPLANT
SET HNDPC FAN SPRY TIP SCT (DISPOSABLE) IMPLANT
SUT ETHIBOND 2 V 37 (SUTURE) ×3 IMPLANT
SUT VIC AB 0 CTB1 27 (SUTURE) ×3 IMPLANT
SUT VIC AB 1 CTX 36 (SUTURE) ×3
SUT VIC AB 1 CTX36XBRD ANBCTR (SUTURE) ×1 IMPLANT
SUT VIC AB 2-0 CTB1 (SUTURE) ×3 IMPLANT
SUT VIC AB 3-0 SH 27 (SUTURE) ×3
SUT VIC AB 3-0 SH 27X BRD (SUTURE) ×1 IMPLANT
SYR CONTROL 10ML LL (SYRINGE) ×3 IMPLANT
TOWEL OR 17X24 6PK STRL BLUE (TOWEL DISPOSABLE) ×3 IMPLANT
TOWEL OR 17X26 10 PK STRL BLUE (TOWEL DISPOSABLE) ×3 IMPLANT
TOWER CARTRIDGE SMART MIX (DISPOSABLE) IMPLANT
TRAY FOLEY CATH 14FR (SET/KITS/TRAYS/PACK) IMPLANT
WATER STERILE IRR 1000ML POUR (IV SOLUTION) ×4 IMPLANT

## 2013-07-17 NOTE — Progress Notes (Addendum)
May give additional 2 mg dilaudid per Dr Ivin Booty

## 2013-07-17 NOTE — Progress Notes (Signed)
Utilization review completed.  

## 2013-07-17 NOTE — Interval H&P Note (Signed)
History and Physical Interval Note:  07/17/2013 10:06 AM  Dominic Walters  has presented today for surgery, with the diagnosis of RIGHT HIP AVASCULAR NECROSIS/DEVELOPEMENTAL HIP DYSPLASIA  The various methods of treatment have been discussed with the patient and family. After consideration of risks, benefits and other options for treatment, the patient has consented to  Procedure(s): TOTAL HIP ARTHROPLASTY (Right) as a surgical intervention .  The patient's history has been reviewed, patient examined, no change in status, stable for surgery.  I have reviewed the patient's chart and labs.  Questions were answered to the patient's satisfaction.     Nestor Lewandowsky

## 2013-07-17 NOTE — Transfer of Care (Signed)
Immediate Anesthesia Transfer of Care Note  Patient: Dominic Walters  Procedure(s) Performed: Procedure(s): TOTAL HIP ARTHROPLASTY (Right)  Patient Location: PACU  Anesthesia Type:General  Level of Consciousness: awake and alert   Airway & Oxygen Therapy: Patient Spontanous Breathing  Post-op Assessment: Report given to PACU RN and Post -op Vital signs reviewed and stable  Post vital signs: Reviewed and stable  Complications: No apparent anesthesia complications

## 2013-07-17 NOTE — Progress Notes (Signed)
[facility]  Patient Status: New  [facility] is providing the following services: PT - referral from MD office. Thank you  If patient discharges after hours, please call [phone].   Jodene Nam 07/17/2013, 2:44 PM

## 2013-07-17 NOTE — Progress Notes (Signed)
Physical Therapy Evaluation Patient Details Name: Dominic Walters MRN: [id number] DOB: [birth year] Today's Date: 07/17/2013   History of Present Illness  Patient is a 43 yo male admitted 07/17/13 and is s/p Rt THA with posterior hip precautions.  Patient with h/o chronic back pain, HTN, and CVA.  Clinical Impression  Patient presents with problems listed below.  Will benefit from acute PT to maximize independence prior to discharge home with wife.    Follow Up Recommendations Home health PT;Supervision/Assistance - 24 hour    Equipment Recommendations  Rolling walker with 5" wheels;3in1 (PT)    Recommendations for Other Services       Precautions / Restrictions Precautions Precautions: Posterior Hip Precaution Booklet Issued: Yes (comment) Precaution Comments: Reviewed precautions with patient. Restrictions Weight Bearing Restrictions: Yes RLE Weight Bearing: Weight bearing as tolerated      Mobility  Bed Mobility Overal bed mobility: Needs Assistance Bed Mobility: Supine to Sit;Sit to Supine     Supine to sit: Min assist Sit to supine: Min assist   General bed mobility comments: Verbal cues for bed mobility while maintaining hip precautions.  Assist to move RLE off of and onto bed.    Transfers Overall transfer level: Needs assistance Equipment used: Rolling walker (2 wheeled) Transfers: Sit to/from Stand Sit to Stand: Min guard         General transfer comment: Verbal cues for hand placement and technique.  Assist for balance/safety only.  Ambulation/Gait Ambulation/Gait assistance: Min guard Ambulation Distance (Feet): 78 Feet Assistive device: Rolling walker (2 wheeled) Gait Pattern/deviations: Step-to pattern;Decreased stance time - right;Decreased step length - left;Antalgic;Trunk flexed Gait velocity: Decreased Gait velocity interpretation: Below normal speed for age/gender General Gait Details: Verbal cues for safe use of RW and gait sequence.  Cues to  keep RW closer to body - patient pushing RW too far forward.   Stairs            Wheelchair Mobility    Modified Rankin (Stroke Patients Only)       Balance                                             Pertinent Vitals/Pain Pain 8/10 impacting mobility    Home Living Family/patient expects to be discharged to:: Private residence Living Arrangements: Spouse/significant other Available Help at Discharge: Family;Available 24 hours/day Type of Home: House Home Access: Stairs to enter Entrance Stairs-Rails: None Entrance Stairs-Number of Steps: 3 Home Layout: Able to live on main level with bedroom/bathroom Home Equipment: None      Prior Function Level of Independence: Independent               Hand Dominance        Extremity/Trunk Assessment   Upper Extremity Assessment: Overall WFL for tasks assessed           Lower Extremity Assessment: RLE deficits/detail RLE Deficits / Details: Decreased strength and ROM due to surgery, pain, and precautions. Able to assist with moving RLE off of bed.       Communication   Communication: No difficulties  Cognition Arousal/Alertness: Awake/alert Behavior During Therapy: WFL for tasks assessed/performed Overall Cognitive Status: Within Functional Limits for tasks assessed                      General Comments      Exercises Total  Joint Exercises Ankle Circles/Pumps: AROM;Both;10 reps;Supine      Assessment/Plan    PT Assessment Patient needs continued PT services  PT Diagnosis Difficulty walking;Acute pain   PT Problem List Decreased strength;Decreased range of motion;Decreased activity tolerance;Decreased balance;Decreased mobility;Decreased knowledge of use of DME;Decreased knowledge of precautions;Pain  PT Treatment Interventions DME instruction;Gait training;Stair training;Functional mobility training;Therapeutic exercise;Patient/family education   PT Goals (Current  goals can be found in the Care Plan section) Acute Rehab PT Goals Patient Stated Goal: To decrease pain PT Goal Formulation: With patient Time For Goal Achievement: 07/24/13 Potential to Achieve Goals: Good    Frequency 7X/week   Barriers to discharge        Co-evaluation               End of Session Equipment Utilized During Treatment: Gait belt Activity Tolerance: Patient limited by pain;Patient tolerated treatment well Patient left: in bed;with call bell/phone within reach;with nursing/sitter in room (IV team to restart IV) Nurse Communication: Mobility status;Patient requests pain meds         Time: [id number] PT Time Calculation (min): 29 min   Charges:   PT Evaluation $Initial PT Evaluation Tier I: 1 Procedure PT Treatments $Gait Training: 8-22 mins $Therapeutic Activity: 8-22 mins   PT G Codes:          Vena AustriaSusan H Aireonna Bauer 07/17/2013, 6:42 PM Durenda HurtSusan H. Renaldo Fiddleravis, PT, [facility] Acute Rehab Services Pager [phone]

## 2013-07-17 NOTE — Progress Notes (Signed)
Patient lost IV access while transferring out of the bed. Attempts to re-stick made by nursing and the IV team, however no access was gained. Patient encouraged to increase PO intake. Danielle PA-C was on-call for the office and made aware. New orders for PO PRN severe pain medicine given. Will continue to monitor.

## 2013-07-17 NOTE — Anesthesia Preprocedure Evaluation (Addendum)
Anesthesia Evaluation  Patient identified by MRN, date of birth, ID band Patient awake    Reviewed: Allergy & Precautions, H&P , NPO status , Patient's Chart, lab work & pertinent test results  Airway Mallampati: I TM Distance: >3 FB Neck ROM: Full    Dental  (+) Dental Advisory Given, Teeth Intact   Pulmonary Current Smoker,  breath sounds clear to auscultation        Cardiovascular hypertension, Pt. on medications Rhythm:Regular Rate:Normal     Neuro/Psych    GI/Hepatic   Endo/Other  Morbid obesity  Renal/GU      Musculoskeletal   Abdominal   Peds  Hematology   Anesthesia Other Findings   Reproductive/Obstetrics                          Anesthesia Physical Anesthesia Plan  ASA: III  Anesthesia Plan: General   Post-op Pain Management:    Induction: Intravenous  Airway Management Planned: Oral ETT  Additional Equipment:   Intra-op Plan:   Post-operative Plan: Extubation in OR  Informed Consent: I have reviewed the patients History and Physical, chart, labs and discussed the procedure including the risks, benefits and alternatives for the proposed anesthesia with the patient or authorized representative who has indicated his/her understanding and acceptance.   Dental advisory given  Plan Discussed with: CRNA, Anesthesiologist and Surgeon  Anesthesia Plan Comments:         Anesthesia Quick Evaluation

## 2013-07-17 NOTE — Op Note (Signed)
OPERATIVE REPORT    DATE OF PROCEDURE:  07/17/2013       PREOPERATIVE DIAGNOSIS:  RIGHT HIP AVASCULAR NECROSIS/DEVELOPEMENTAL HIP DYSPLASIA                                                          POSTOPERATIVE DIAGNOSIS:  RIGHT HIP AVASCULAR NECROSIS/DEVELOPEMENTAL HIP DYSPLASIA                                                           PROCEDURE:  R total hip arthroplasty using a 52 mm DePuy Pinnacle  Cup, Apex Hole Eliminator, 10-degree polyethylene liner index superior  and posterior, a +3 36 mm ceramic head, a 18x13x42x160 SROM stem, 18Fsm Sleeve   SURGEON: Nestor LewandowskyFrank J Kaidan Harpster    ASSISTANT:   Tomi LikensEric K. Gaylene BrooksPhillips PA-C  (present throughout entire procedure and necessary for timely completion of the procedure)   ANESTHESIA: General BLOOD LOSS: 300 FLUID REPLACEMENT: 1500 crystalloid DRAINS: Foley Catheter URINE OUTPUT: 300cc COMPLICATIONS: none    INDICATIONS FOR PROCEDURE: A [age] y.o. year-old With  RIGHT HIP AVASCULAR NECROSIS/DEVELOPEMENTAL HIP DYSPLASIA   for 3 years, x-rays show bone-on-bone arthritic changes. Despite conservative measures with observation, anti-inflammatory medicine, narcotics, use of a cane, has severe unremitting pain and can ambulate only a few blocks before resting.  Patient desires elective R total hip arthroplasty to decrease pain and increase function. The risks, benefits, and alternatives were discussed at length including but not limited to the risks of infection, bleeding, nerve injury, stiffness, blood clots, the need for revision surgery, cardiopulmonary complications, among others, and they were willing to proceed. Questions answered     PROCEDURE IN DETAIL: The patient was identified by armband,  [facility], taken to the operating room , appropriate anesthetic monitors  were attached and general endotracheal anesthesia induced. Foley catheter was inserted. Pt was rolled into the L lateral  decubitus position and fixed there with a Stulberg Mark II pelvic clamp.  The R lower extremity was then prepped and draped  in the usual sterile fashion from the ankle to the hemipelvis. A time-out  procedure was performed. The skin along the lateral hip and thigh  infiltrated with 10 mL of 0.5% Marcaine and epinephrine solution. We  then made a posterolateral approach to the hip. With a #10 blade, a 20 cm  incision was made through the skin and subcutaneous tissue down to the level of the  IT band. Small bleeders were identified and cauterized. The IT band was cut in  line with skin incision exposing the greater trochanter. A Cobra retractor was placed between the gluteus minimus and the superior hip joint capsule, and a spiked Cobra between the quadratus femoris and the inferior hip joint capsule. This isolated the short  external rotators and piriformis tendons. These were tagged with a #2 Ethibond  suture and cut off their insertion on the intertrochanteric crest. The posterior  capsule was then developed into an acetabular-based flap from Posterior Superior off of the acetabulum out over the femoral neck and back posterior inferior to the  acetabular rim. This flap was tagged with two #2 Ethibond sutures and retracted protecting the sciatic nerve. This exposed the arthritic femoral head and osteophytes. The hip was then flexed and internally rotated, dislocating the femoral head and a standard neck cut performed 1 fingerbreadth above the lesser trochanter.  A spiked Cobra was placed in the cotyloid notch and a Hohmann retractor was then used to lever the femur anteriorly off of the anterior pelvic column. A posterior-inferior wing retractor was placed at the junction of the acetabulum and the ischium completing the acetabular exposure.We then removed the peripheral osteophytes and labrum from the acetabulum. We then reamed the acetabulum up to 51 mm with basket reamers obtaining good coverage in all  quadrants. We then irrigated with normal  saline solution and hammered into place a 52 mm pinnacle cup in 45  degrees of abduction and about 20 degrees of anteversion. More  peripheral osteophytes removed and a trial 10-degree liner placed with the  index superior-posterior. The hip was then flexed and internally rotated exposing the  proximal femur, which was entered with the initiating reamer followed by  the axial reamers up to a 13.5 mm full depth and 51mm partial depth. We then conically reamed to 80F to the correct depth for a 42 base neck. The calcar was milled to 80Fsm. A trial cone and stem was inserted in the 25 degrees anteversion, with a +0 68mm trial head. Trial reduction was then performed and excellent stability was noted with at 90 of flexion with 75 of internal rotation and then full extension with maximal external rotation. The hip could not be dislocated in full extension. The knee could easily flex  to about 130 degrees. We also stretched the abductors at this point,  because of the preexisting adductor contractures. All trial components  were then removed. The acetabulum was irrigated out with normal saline  solution. A titanium Apex [facility] was then screwed into place  followed by a 10-degree polyethylene liner index superior-posterior. On  the femoral side a 80Fsm ZTT1 sleeve was hammered into place, followed by a 18x13x42x160 SROM stem in 25 degrees of anteversion. At this point, a +3 36 mm ceramic head was  hammered on the stem. The hip was reduced. We checked our stability  one more time and found it to be excellent. The wound was once again  thoroughly irrigated out with normal saline solution pulse lavage. The  capsular flap and short external rotators were repaired back to the  intertrochanteric crest through drill holes with a #2 Ethibond suture.  The IT band was closed with running 1 Vicryl suture. The subcutaneous  tissue with 0 and 2-0 undyed Vicryl suture  and the skin with running  interlocking 3-0 nylon suture. Dressing of Xeroform and Mepilex was  then applied. The patient was then unclamped, rolled supine, awaken extubated and taken to recovery room without difficulty in stable condition.   Nestor Lewandowsky 07/17/2013, 12:02 PM

## 2013-07-17 NOTE — Anesthesia Postprocedure Evaluation (Signed)
  Anesthesia Post-op Note  Patient: Dominic Walters  Procedure(s) Performed: Procedure(s): TOTAL HIP ARTHROPLASTY (Right)  Patient Location: PACU  Anesthesia Type:General  Level of Consciousness: awake, alert  and oriented  Airway and Oxygen Therapy: Patient Spontanous Breathing  Post-op Pain: mild  Post-op Assessment: Post-op Vital signs reviewed  Post-op Vital Signs: Reviewed  Last Vitals:  Filed Vitals:   07/17/13 1339  BP:   Pulse: 95  Temp:   Resp: 16    Complications: No apparent anesthesia complications

## 2013-07-18 ENCOUNTER — Encounter (HOSPITAL_COMMUNITY): Payer: Self-pay | Admitting: *Deleted

## 2013-07-18 LAB — CBC
HCT: 37.8 % — ABNORMAL LOW (ref 39.0–52.0)
Hemoglobin: 12 g/dL — ABNORMAL LOW (ref 13.0–17.0)
MCH: 29.5 pg (ref 26.0–34.0)
MCHC: 31.7 g/dL (ref 30.0–36.0)
MCV: 92.9 fL (ref 78.0–100.0)
PLATELETS: 235 10*3/uL (ref 150–400)
RBC: 4.07 MIL/uL — AB (ref 4.22–5.81)
RDW: 13.9 % (ref 11.5–15.5)
WBC: 14.1 10*3/uL — AB (ref 4.0–10.5)

## 2013-07-18 LAB — BASIC METABOLIC PANEL
BUN: 15 mg/dL (ref 6–23)
CHLORIDE: 101 meq/L (ref 96–112)
CO2: 24 mEq/L (ref 19–32)
Calcium: 9.1 mg/dL (ref 8.4–10.5)
Creatinine, Ser: 1.08 mg/dL (ref 0.50–1.35)
GFR calc Af Amer: >90 mL/min (ref >90)
GFR calc non Af Amer: 82 mL/min — ABNORMAL LOW (ref >90)
GLUCOSE: 120 mg/dL — AB (ref 70–99)
POTASSIUM: 3.8 meq/L (ref 3.7–5.3)
SODIUM: 140 meq/L (ref 137–147)

## 2013-07-18 MED ORDER — MORPHINE SULFATE 2 MG/ML IJ SOLN: 2.0000 mg | INTRAMUSCULAR | Status: DC | PRN

## 2013-07-18 NOTE — Progress Notes (Signed)
Physical Therapy Treatment Patient Details Name: Dominic Walters MRN: [id number] DOB: 12-14-1970 Today's Date: 08-17-13    History of Present Illness      PT Comments    Rx limited to exercises this AM due to pain.  PT to address mobility this PM if pain allows.  Follow Up Recommendations  Home health PT;Supervision/Assistance - 24 hour     Equipment Recommendations  Rolling walker with 5" wheels    Recommendations for Other Services       Precautions / Restrictions Precautions Precautions: Posterior Hip Precaution Comments: Pt independently recalled 1/3 precautions.  Reviewed 3/3 precautions with pt. Restrictions RLE Weight Bearing: Weight bearing as tolerated    Mobility  Bed Mobility                  Transfers                    Ambulation/Gait                 Stairs            Wheelchair Mobility    Modified Rankin (Stroke Patients Only)       Balance                                    Cognition Arousal/Alertness: Awake/alert Behavior During Therapy: WFL for tasks assessed/performed Overall Cognitive Status: Within Functional Limits for tasks assessed       Memory: Decreased recall of precautions              Exercises Total Joint Exercises Ankle Circles/Pumps: AROM;Both;10 reps Quad Sets: AROM;Both;10 reps Gluteal Sets: AROM;Both;10 reps Heel Slides: AAROM;Right;10 reps Hip ABduction/ADduction: AAROM;Right;10 reps    General Comments        Pertinent Vitals/Pain 8/10    Home Living                      Prior Function            PT Goals (current goals can now be found in the care plan section) Progress towards PT goals: Progressing toward goals    Frequency  7X/week    PT Plan Current plan remains appropriate    Co-evaluation             End of Session   Activity Tolerance: Patient limited by pain Patient left: in bed;with call bell/phone within reach      Time: 1655-3748 PT Time Calculation (min): 16 min  Charges:  $Therapeutic Exercise: 8-22 mins                    G Codes:      Danelle Berry 08/17/13, 9:43 AM  Aida Raider, PT  Office # [phone] Pager [phone]

## 2013-07-18 NOTE — Progress Notes (Signed)
   PATIENT ID: Dominic Walters   1 Day Post-Op Procedure(s) (LRB): TOTAL HIP ARTHROPLASTY (Right)  Subjective: Patient reports pain overnight, increasing into this am. Nursing had trouble with the IV last night and he elected to try oral rx. He would like to get started back on IV pain rx this am as he is having trouble with pain. IV was started this am and hopefully this will help.   Objective:  Filed Vitals:   07/18/13 0526  BP: 119/68  Pulse: 106  Temp: 99.9 F (37.7 C)  Resp: 18     Right hip dressing c/d/i Wiggles toes, distally NVI Calf soft nontender  Labs:   Recent Labs  07/18/13 0405  HGB 12.0*   Recent Labs  07/18/13 0405  WBC 14.1*  RBC 4.07*  HCT 37.8*  PLT 235   Recent Labs  07/18/13 0405  NA 140  K 3.8  CL 101  CO2 24  BUN 15  CREATININE 1.08  GLUCOSE 120*  CALCIUM 9.1    Assessment and Plan: 1 day s/p right THA Will re-order IV morphine for pain and d/c oral dilaudid Hopefully he can ween off IV pain rx today/tomorrow D/c home pending PT, likely tomorrow  VTE proph: Lovenox 120 q 12 hr, SCDs

## 2013-07-18 NOTE — Progress Notes (Signed)
PT Cancellation Note  Patient Details Name: NOVAK RIEHM MRN: [id number] DOB: 11/25/1970   Cancelled Treatment:    Reason Eval/Treat Not Completed: Pain limiting ability to participate  Pt declining participation in PM PT Rx.  He reports he just ambulated with OT to/from bathroom and his pain is too high to perform any further mobility today.   Danelle Berry 07/18/2013, 2:11 PM  Aida Raider, PT  Office # [phone] Pager [phone]

## 2013-07-18 NOTE — Evaluation (Signed)
Occupational Therapy Evaluation Patient Details Name: Dominic Walters MRN: [id number] DOB: 1970/04/15 Today's Date: 07/18/2013    History of Present Illness Patient is a 43 yo male admitted 07/17/13 and is s/p Rt THA with posterior hip precautions.  Patient with h/o chronic back pain, HTN, and CVA.   Clinical Impression   Pt admitted with the above diagnoses and presents with below problem list. Pt will benefit from continued acute OT to address the below listed deficits and maximize independence with basic ADLs prior to d/c home with spouse. Pt completed bed mobility initially with min A (RLE), at end of session performed with mod A(BLE) due to increase pain and fatigue. Pt ambulated min guard to stand at toilet. Completed grooming tasks in standing with min guard. Pt unable to lift RLE off ground during in-room ambulation this session, scoots R foot to compensate. Education on techniques and AE for safe completion of ADLs with post hip precautions.    Follow Up Recommendations  Supervision/Assistance - 24 hour;No OT follow up    Equipment Recommendations  3 in 1 bedside comode    Recommendations for Other Services       Precautions / Restrictions Precautions Precautions: Posterior Hip Precaution Comments: pt able to state 2/3 precautions. Reviewed precautions and functional impact. Restrictions Weight Bearing Restrictions: Yes RLE Weight Bearing: Weight bearing as tolerated      Mobility Bed Mobility Overal bed mobility: Needs Assistance Bed Mobility: Sit to Supine;Supine to Sit     Supine to sit: Min assist;HOB elevated Sit to supine: Mod assist   General bed mobility comments: Pt needing min A to support RLE during supipne>sit. At end of session pt needed mod A to assist BLE in sit>supine due to increased pain and fatigue.  Transfers Overall transfer level: Needs assistance Equipment used: Rolling walker (2 wheeled) Transfers: Sit to/from Stand Sit to Stand: Min  guard         General transfer comment: Pt moved quickly into standing after supine >EOB. Pt reports EOB is painful. Able to assist through BUE on bedrails to scoot up in bed in trendelenburg at end of session.    Balance Overall balance assessment: Needs assistance Sitting-balance support: Bilateral upper extremity supported;Feet supported Sitting balance-Leahy Scale: Fair     Standing balance support: Bilateral upper extremity supported;During functional activity Standing balance-Leahy Scale: Poor                              ADL Overall ADL's : Needs assistance/impaired Eating/Feeding: Set up;Sitting   Grooming: Min guard;Standing   Upper Body Bathing: Min guard;Sitting   Lower Body Bathing: Sit to/from stand;Moderate assistance   Upper Body Dressing : Set up;Sitting   Lower Body Dressing: Moderate assistance;Sit to/from stand   Toilet Transfer: Min guard;Ambulation (in standing at toilet)   Toileting- Clothing Manipulation and Hygiene: Sit to/from stand;Min guard   Tub/ Shower Transfer: Min guard;Ambulation;3 in 1;Rolling walker   Functional mobility during ADLs: Min guard;Rolling walker General ADL Comments: Education on techniques and AE for safe completion of ADLs with post hip precautions.      Vision                     Perception     Praxis      Pertinent Vitals/Pain 6/10 pain in RLE at start and end of session. Increased pain EOB and OOB.     Hand Dominance  Extremity/Trunk Assessment Upper Extremity Assessment Upper Extremity Assessment: Overall WFL for tasks assessed   Lower Extremity Assessment Lower Extremity Assessment: Defer to PT evaluation       Communication Communication Communication: No difficulties   Cognition Arousal/Alertness: Awake/alert Behavior During Therapy: WFL for tasks assessed/performed Overall Cognitive Status: Within Functional Limits for tasks assessed                    General  Comments       Exercises       Shoulder Instructions      Home Living Family/patient expects to be discharged to:: Private residence Living Arrangements: Spouse/significant other Available Help at Discharge: Family;Available 24 hours/day Type of Home: House Home Access: Stairs to enter Entergy CorporationEntrance Stairs-Number of Steps: 3 Entrance Stairs-Rails: None Home Layout: Able to live on main level with bedroom/bathroom     Bathroom Shower/Tub: Producer, television/film/videoWalk-in shower   Bathroom Toilet: Standard Bathroom Accessibility: Yes How Accessible: Accessible via walker            Prior Functioning/Environment Level of Independence: Independent             OT Diagnosis: Acute pain   OT Problem List: Decreased strength;Decreased range of motion;Decreased activity tolerance;Impaired balance (sitting and/or standing);Decreased safety awareness;Decreased knowledge of use of DME or AE;Decreased knowledge of precautions;Pain;Obesity   OT Treatment/Interventions: Self-care/ADL training;Therapeutic exercise;DME and/or AE instruction;Therapeutic activities;Patient/family education;Balance training    OT Goals(Current goals can be found in the care plan section) Acute Rehab OT Goals Patient Stated Goal: to walk OT Goal Formulation: With patient Time For Goal Achievement: 07/25/13 Potential to Achieve Goals: Good ADL Goals Pt Will Perform Lower Body Bathing: with supervision;with adaptive equipment;sit to/from stand Pt Will Perform Lower Body Dressing: with supervision;with adaptive equipment;sit to/from stand Pt Will Transfer to Toilet: with supervision;ambulating (3n1 over toilet) Pt Will Perform Toileting - Clothing Manipulation and hygiene: with supervision;sit to/from stand Pt Will Perform Tub/Shower Transfer: with supervision;ambulating;3 in 1;rolling walker Additional ADL Goal #1: Pt will perform sit<>supine at supervision level while adhering to posterior hip precautions to prepare for OOB ADLs.   OT Frequency: Min 2X/week   Barriers to D/C:            Co-evaluation              End of Session Equipment Utilized During Treatment: Gait belt;Rolling walker  Activity Tolerance: Patient limited by pain Patient left: in bed;with call bell/phone within reach;with family/visitor present   Time: 4010-27251152-1234 OT Time Calculation (min): 42 min Charges:  OT General Charges $OT Visit: 1 Procedure OT Evaluation $Initial OT Evaluation Tier I: 1 Procedure OT Treatments $Self Care/Home Management : 23-37 mins G-Codes:    Pilar GrammesKathryn H Jehad Bisono 07/18/2013, 1:04 PM

## 2013-07-18 NOTE — Progress Notes (Signed)
Night shift IV team returned to floor to attempt IV access. Pt expressed desire to not have IV reestablished. This RN discussed situation with pt, explained benefit of maintaining access. Pt agreed as long as IV could remained saline locked. Pt encouraged to increase/continue PO fluid intake and explained that nursing staff would be monitoring urine output closely throughout shift.

## 2013-07-19 LAB — CBC
HEMATOCRIT: 33.3 % — AB (ref 39.0–52.0)
HEMOGLOBIN: 10.8 g/dL — AB (ref 13.0–17.0)
MCH: 30.3 pg (ref 26.0–34.0)
MCHC: 32.4 g/dL (ref 30.0–36.0)
MCV: 93.5 fL (ref 78.0–100.0)
PLATELETS: 194 10*3/uL (ref 150–400)
RBC: 3.56 MIL/uL — ABNORMAL LOW (ref 4.22–5.81)
RDW: 13.8 % (ref 11.5–15.5)
WBC: 13.4 10*3/uL — AB (ref 4.0–10.5)

## 2013-07-19 NOTE — Progress Notes (Signed)
   PATIENT ID: Dominic Walters   2 Days Post-Op Procedure(s) (LRB): TOTAL HIP ARTHROPLASTY (Right)  Subjective: Reports feeling very well this am. Slept well overnight. Pain under control. Hopes to go home today after working with PT. No other complaints or concerns.  Objective:  Filed Vitals:   07/19/13 0925  BP: 112/59  Pulse: 103  Temp:   Resp:      Right hip dressing c/d/i  Wiggles toes, distally NVI  Calf soft nontender   Labs:   Recent Labs  07/18/13 0405 07/19/13 0613  HGB 12.0* 10.8*   Recent Labs  07/18/13 0405 07/19/13 0613  WBC 14.1* 13.4*  RBC 4.07* 3.56*  HCT 37.8* 33.3*  PLT 235 194   Recent Labs  07/18/13 0405  NA 140  K 3.8  CL 101  CO2 24  BUN 15  CREATININE 1.08  GLUCOSE 120*  CALCIUM 9.1    Assessment and Plan: 2 days s/p right THA Up with PT today Okay d/c home after PT clearance D/c orders in/script in chart Will continue lovenox inj BID x 2 weeks, then hematologist will bridge to coumadin, already have an appointment set up  Follow up Dr. Turner Daniels in 2 weeks  VTE proph: lovenox, SCDs

## 2013-07-19 NOTE — Progress Notes (Signed)
Physical Therapy Treatment Patient Details Name: Dominic Walters MRN: [id number] DOB: 27-Jul-1970 Today's Date: 08-16-2013    History of Present Illness      PT Comments    Stair training completed today.    Follow Up Recommendations  Home health PT;Supervision/Assistance - 24 hour     Equipment Recommendations  Rolling walker with 5" wheels    Recommendations for Other Services       Precautions / Restrictions Precautions Precautions: Posterior Hip Precaution Comments: Reviewed 3/3 hip precautions. Restrictions Weight Bearing Restrictions: Yes RLE Weight Bearing: Weight bearing as tolerated    Mobility  Bed Mobility         Supine to sit: Min assist     General bed mobility comments: Assist with RLE.  Verbal cues for precautions.  Transfers   Equipment used: Rolling walker (2 wheeled)   Sit to Stand: Min guard            Ambulation/Gait Ambulation/Gait assistance: Min guard Ambulation Distance (Feet): 150 Feet Assistive device: Rolling walker (2 wheeled) Gait Pattern/deviations: Trunk flexed;Decreased stride length;Antalgic Gait velocity: decreased   General Gait Details: verbal cues for sequencing and RW management   Stairs Stairs: Yes Stairs assistance: Min assist Stair Management: No rails;Backwards;With walker Number of Stairs: 3 General stair comments: verbal cues for sequencing  Wheelchair Mobility    Modified Rankin (Stroke Patients Only)       Balance                                    Cognition Arousal/Alertness: Awake/alert Behavior During Therapy: WFL for tasks assessed/performed Overall Cognitive Status: Within Functional Limits for tasks assessed       Memory: Decreased recall of precautions              Exercises      General Comments        Pertinent Vitals/Pain 6/10 impacting mobility    Home Living                      Prior Function            PT Goals (current goals  can now be found in the care plan section) Progress towards PT goals: Progressing toward goals    Frequency  7X/week    PT Plan Current plan remains appropriate    Co-evaluation             End of Session Equipment Utilized During Treatment: Gait belt Activity Tolerance: Patient tolerated treatment well Patient left: in chair;with call bell/phone within reach;with family/visitor present     Time: 4401-0272 PT Time Calculation (min): 39 min  Charges:  $Gait Training: 38-52 mins                    G Codes:      Danelle Berry August 16, 2013, 11:00 AM  Aida Raider, PT  Office # [phone] Pager [phone]

## 2013-07-19 NOTE — Care Management Note (Signed)
    Page 1 of 2   07/19/2013     10:28:00 AM CARE MANAGEMENT NOTE 07/19/2013  Patient:  RONDO, SPITTLER   Account Number:  [id number]  Date Initiated:  07/19/2013  Documentation initiated by:  [facility]  Subjective/Objective Assessment:   adm:  right hip pain; TOTAL HIP ARTHROPLASTY (Right)     Action/Plan:   discharge planning   Anticipated DC Date:  07/20/2013   Anticipated DC Plan:  [place]  CM consult      [facility] Choice  HOME HEALTH   Choice offered to / List presented to:  C-1 Patient   DME arranged  3-N-1  [place] arranged  HH-1 RN  [place].   Status of service:  Completed, signed off Medicare Important Message given?   (If response is "NO", the following Medicare IM given date fields will be blank) Date Medicare IM given:   Date Additional Medicare IM given:    Discharge Disposition:  [place]  Per UR Regulation:    If discussed at Long Length of Stay Meetings, dates discussed:    Comments:  07/19/13 08:45 CM  met with pt and pt's wife, Lynelle Smoke [phone] a nurse, for choice of [place] agency.  [facility] had already touched base with the family and family agreeable to [facility] rendering HHPT/OT/RN.  3n1, rolling walker, and shower seat to be delivered to room prior to discharge. DME delivery rep notified to deliver 3n1, rolling walker, and shower seat prior to discharge.  No other CM needs were communicated. Mariane Masters, BSN, CM [phone].

## 2013-07-21 ENCOUNTER — Encounter (HOSPITAL_COMMUNITY): Payer: Self-pay | Admitting: Orthopedic Surgery

## 2013-07-21 NOTE — Discharge Summary (Signed)
Patient ID: Dominic Walters MRN: [id number] DOB/AGE: [birth year] 43 y.o.  Admit date: 07/17/2013 Discharge date: 07/21/2013  Admission Diagnoses:  Principal Problem:   Arthritis of right hip   Discharge Diagnoses:  Same  Past Medical History  Diagnosis Date  . Hypertension   . Stroke   . Back pain, chronic     Herniated L 3, 4, 5.  . DVT (deep venous thrombosis)   . Antiphospholipid antibody syndrome   . Blood in urine     Surgeries: Procedure(s): TOTAL HIP ARTHROPLASTY on 07/17/2013   Consultants:    Discharged Condition: Improved  Hospital Course: Dominic Eldersllen R Bartles is an 43 y.o. male who was admitted 07/17/2013 for operative treatment ofArthritis of right hip. history of pain and functional disability in the right hip(s) due to arthritis and patient has failed non-surgical conservative treatments for greater than 12 weeks to include NSAID's and/or analgesics, flexibility and strengthening excercises, use of assistive devices and activity modification.   Patient has severe unremitting pain that affects sleep, daily activities, and work/hobbies. After pre-op clearance the patient was taken to the operating room on 07/17/2013 and underwent  Procedure(s): TOTAL HIP ARTHROPLASTY.    Patient was given perioperative antibiotics: Anti-infectives   Start     Dose/Rate Route Frequency Ordered Stop   07/17/13 0600  ceFAZolin (ANCEF) 3 g in dextrose 5 % 50 mL IVPB     3 g 160 mL/hr over 30 Minutes Intravenous On call to O.R. 07/16/13 1422 07/17/13 1033       Patient was given sequential compression devices, early ambulation, and Lovenox to prevent DVT.  Patient benefited maximally from hospital stay and there were no complications.    Recent vital signs: No data found.    Recent laboratory studies:  Recent Labs  07/19/13 0613  WBC 13.4*  HGB 10.8*  HCT 33.3*  PLT 194     Discharge Medications:     Medication List    STOP taking these medications       aspirin 81 MG  tablet     warfarin 2 MG tablet  Commonly known as:  COUMADIN      TAKE these medications       atorvastatin 40 MG tablet  Commonly known as:  LIPITOR  Take 1 tablet (40 mg total) by mouth daily.     b complex vitamins tablet  Take 1 tablet by mouth daily.     diazepam 10 MG tablet  Commonly known as:  VALIUM  Take 1 tablet (10 mg total) by mouth every 12 (twelve) hours as needed for anxiety.     enoxaparin 120 MG/0.8ML injection  Commonly known as:  LOVENOX  Inject 0.8 mLs (120 mg total) into the skin every 12 (twelve) hours.     folic acid 400 MCG tablet  Commonly known as:  FOLVITE  Take 400 mcg by mouth daily.     lisinopril 20 MG tablet  Commonly known as:  PRINIVIL,ZESTRIL  Take 20 mg by mouth daily.     methocarbamol 500 MG tablet  Commonly known as:  ROBAXIN  Take 1 tablet (500 mg total) by mouth 2 (two) times daily with a meal.     oxyCODONE-acetaminophen 5-325 MG per tablet  Commonly known as:  ROXICET  Take 1 tablet by mouth every 4 (four) hours as needed.        Diagnostic Studies: Dg Chest 2 View  07/14/2013   CLINICAL DATA:  Hypertension.  EXAM: CHEST  2 VIEW  COMPARISON:  None.  FINDINGS: The heart size and mediastinal contours are within normal limits. Both lungs are clear. The visualized skeletal structures are unremarkable.  IMPRESSION: No acute cardiopulmonary abnormality seen.   Electronically Signed   By: Roque Lias M.D.   On: 07/14/2013 11:51   Dg Pelvis Portable  07/17/2013   CLINICAL DATA:  Postop right hip replacement.  EXAM: PORTABLE PELVIS 1-2 VIEWS  COMPARISON:  Pelvis MRI 06/10/2013  FINDINGS: Total right hip arthroplasty. No adverse features such is dislocation or periprosthetic fracture. Expected subcutaneous gas. Mild degenerative marginal spurring to the left acetabular rim.  IMPRESSION: Total right hip arthroplasty.  No adverse features.   Electronically Signed   By: Tiburcio Pea M.D.   On: 07/17/2013 14:57    Disposition:  01-Home or Self Care      Discharge Instructions   Call MD / Call 911    Complete by:  As directed   If you experience chest pain or shortness of breath, CALL 911 and be transported to the hospital emergency room.  If you develope a fever above 101 F, pus (white drainage) or increased drainage or redness at the wound, or calf pain, call your surgeon's office.     Constipation Prevention    Complete by:  As directed   Drink plenty of fluids.  Prune juice may be helpful.  You may use a stool softener, such as Colace (over the counter) 100 mg twice a day.  Use MiraLax (over the counter) for constipation as needed.     Diet - low sodium heart healthy    Complete by:  As directed      Increase activity slowly as tolerated    Complete by:  As directed            Follow-up Information   Follow up with Nestor Lewandowsky, MD In 2 weeks.   Specialty:  Orthopedic Surgery   Contact information:   [address] [place] [address] [phone]       Follow up with [facility]. (home health occupational and physical therapy and nurse)    Contact information:   [address] [place] [address] [phone]       Follow up with [facility]. (rolling walker, 3n1 (commode), shower seat)    Contact information:   [address] [place] [address] [phone]        Signed: Jiles Harold 07/21/2013, 2:52 PM

## 2013-08-25 ENCOUNTER — Telehealth: Payer: Self-pay | Admitting: Family

## 2013-08-25 MED ORDER — ATORVASTATIN CALCIUM 40 MG PO TABS: 40.0000 mg | ORAL_TABLET | Freq: Every day | ORAL | Status: DC

## 2013-08-25 NOTE — Telephone Encounter (Signed)
Done  Pt needs an office visit

## 2013-08-25 NOTE — Telephone Encounter (Signed)
[id number] - [place], [place] - [address] is requesting re-fill on atorvastatin (LIPITOR) 40 MG tablet

## 2014-03-15 ENCOUNTER — Other Ambulatory Visit: Payer: Self-pay | Admitting: Family

## 2014-03-22 ENCOUNTER — Telehealth: Payer: Self-pay | Admitting: Family

## 2014-03-22 NOTE — Telephone Encounter (Signed)
Opened in error/ rx at [facility]

## 2014-06-10 ENCOUNTER — Other Ambulatory Visit: Payer: Self-pay | Admitting: Orthopedic Surgery

## 2014-06-16 ENCOUNTER — Other Ambulatory Visit: Payer: Self-pay | Admitting: Family

## 2014-06-16 ENCOUNTER — Encounter (HOSPITAL_COMMUNITY): Payer: Self-pay

## 2014-06-16 ENCOUNTER — Ambulatory Visit (HOSPITAL_COMMUNITY)
Admission: RE | Admit: 2014-06-16 | Discharge: 2014-06-16 | Disposition: A | Discharge status: Home / Self Care | Payer: BLUE CROSS/BLUE SHIELD | Source: Ambulatory Visit | Attending: Orthopedic Surgery | Admitting: Orthopedic Surgery

## 2014-06-16 ENCOUNTER — Encounter (HOSPITAL_COMMUNITY)
Admission: RE | Admit: 2014-06-16 | Discharge: 2014-06-16 | Disposition: A | Discharge status: Home / Self Care | Payer: BLUE CROSS/BLUE SHIELD | Source: Ambulatory Visit | Attending: Orthopedic Surgery | Admitting: Orthopedic Surgery

## 2014-06-16 ENCOUNTER — Other Ambulatory Visit: Payer: Self-pay

## 2014-06-16 DIAGNOSIS — Z01818 Encounter for other preprocedural examination: Secondary | ICD-10-CM | POA: Insufficient documentation

## 2014-06-16 DIAGNOSIS — I10 Essential (primary) hypertension: Secondary | ICD-10-CM | POA: Insufficient documentation

## 2014-06-16 DIAGNOSIS — Z01812 Encounter for preprocedural laboratory examination: Secondary | ICD-10-CM | POA: Diagnosis not present

## 2014-06-16 DIAGNOSIS — Z8673 Personal history of transient ischemic attack (TIA), and cerebral infarction without residual deficits: Secondary | ICD-10-CM | POA: Diagnosis not present

## 2014-06-16 HISTORY — DX: Peripheral vascular disease, unspecified: I73.9

## 2014-06-16 HISTORY — DX: Disease of blood and blood-forming organs, unspecified: D75.9

## 2014-06-16 LAB — URINALYSIS, ROUTINE W REFLEX MICROSCOPIC
Bilirubin Urine: NEGATIVE
Glucose, UA: NEGATIVE mg/dL
Ketones, ur: NEGATIVE mg/dL
LEUKOCYTES UA: NEGATIVE
Nitrite: NEGATIVE
PROTEIN: NEGATIVE mg/dL
Specific Gravity, Urine: 1.02 (ref 1.005–1.030)
UROBILINOGEN UA: 0.2 mg/dL (ref 0.0–1.0)
pH: 6 (ref 5.0–8.0)

## 2014-06-16 LAB — CBC WITH DIFFERENTIAL/PLATELET
BASOS ABS: 0 10*3/uL (ref 0.0–0.1)
BASOS PCT: 0 % (ref 0–1)
EOS ABS: 0.4 10*3/uL (ref 0.0–0.7)
Eosinophils Relative: 4 % (ref 0–5)
HCT: 46.7 % (ref 39.0–52.0)
Hemoglobin: 15.6 g/dL (ref 13.0–17.0)
LYMPHS PCT: 25 % (ref 12–46)
Lymphs Abs: 3 10*3/uL (ref 0.7–4.0)
MCH: 29.9 pg (ref 26.0–34.0)
MCHC: 33.4 g/dL (ref 30.0–36.0)
MCV: 89.6 fL (ref 78.0–100.0)
Monocytes Absolute: 0.8 10*3/uL (ref 0.1–1.0)
Monocytes Relative: 7 % (ref 3–12)
Neutro Abs: 7.9 10*3/uL — ABNORMAL HIGH (ref 1.7–7.7)
Neutrophils Relative %: 64 % (ref 43–77)
PLATELETS: 253 10*3/uL (ref 150–400)
RBC: 5.21 MIL/uL (ref 4.22–5.81)
RDW: 14.9 % (ref 11.5–15.5)
WBC: 12.1 10*3/uL — ABNORMAL HIGH (ref 4.0–10.5)

## 2014-06-16 LAB — BASIC METABOLIC PANEL
Anion gap: 10 (ref 5–15)
BUN: 18 mg/dL (ref 6–23)
CHLORIDE: 101 mmol/L (ref 96–112)
CO2: 27 mmol/L (ref 19–32)
Calcium: 9.2 mg/dL (ref 8.4–10.5)
Creatinine, Ser: 1.22 mg/dL (ref 0.50–1.35)
GFR calc Af Amer: 82 mL/min — ABNORMAL LOW (ref >90)
GFR calc non Af Amer: 71 mL/min — ABNORMAL LOW (ref >90)
Glucose, Bld: 115 mg/dL — ABNORMAL HIGH (ref 70–99)
Potassium: 4.5 mmol/L (ref 3.5–5.1)
Sodium: 138 mmol/L (ref 135–145)

## 2014-06-16 LAB — SURGICAL PCR SCREEN
MRSA, PCR: NEGATIVE
Staphylococcus aureus: POSITIVE — AB

## 2014-06-16 LAB — PROTIME-INR
INR: 2.1 — AB (ref 0.00–1.49)
PROTHROMBIN TIME: 23.7 s — AB (ref 11.6–15.2)

## 2014-06-16 LAB — TYPE AND SCREEN
ABO/RH(D): A POS
Antibody Screen: NEGATIVE

## 2014-06-16 LAB — URINE MICROSCOPIC-ADD ON

## 2014-06-16 LAB — APTT: APTT: 43 s — AB (ref 24–37)

## 2014-06-16 NOTE — Progress Notes (Signed)
Mupirocin Ointment Rx called into [facility] on [address] [facility] [place] for positive PCR of Staph. Pt notified and voiced understanding.  Pt also stated that if we call him for any reason, it is fine to speak with his wife.

## 2014-06-16 NOTE — Pre-Procedure Instructions (Addendum)
Dominic Walters  06/16/2014   Your procedure is scheduled on:  06/28/14  [facility] short [facility] at 530 AM.  Call this number if you have problems the morning of surgery: [phone]   Remember:   Do not eat food or drink liquids after midnight.   Take these medicines the morning of surgery with A SIP OF WATER: pain med if needed     STOP all herbel meds, nsaids (aleve,naproxen,advil,ibuprofen) 5 days prior to surgery starting 06/23/14 including aspirin, vitamins, folic acid,   stop  Coumadin 06/23/14,    lovenox per dr   Drucilla Schmidto not wear jewelry, make-up or nail polish.  Do not wear lotions, powders, or perfumes. You may wear deodorant.  Do not shave 48 hours prior to surgery. Men may shave face and neck.  Do not bring valuables to the hospital.  [facility] is not responsible                  for any belongings or valuables.               Contacts, dentures or bridgework may not be worn into surgery.  Leave suitcase in the car. After surgery it may be brought to your room.  For patients admitted to the hospital, discharge time is determined by your                treatment team.               Patients discharged the day of surgery will not be allowed to drive  home.  Name and phone number of your driver:   Special Instructions:  Special Instructions: [facility] - Preparing for Surgery  Before surgery, you can play an important role.  Because skin is not sterile, your skin needs to be as free of germs as possible.  You can reduce the number of germs on you skin by washing with CHG (chlorahexidine gluconate) soap before surgery.  CHG is an antiseptic cleaner which kills germs and bonds with the skin to continue killing germs even after washing.  Please DO NOT use if you have an allergy to CHG or antibacterial soaps.  If your skin becomes reddened/irritated stop using the CHG and inform your nurse when you arrive at Short Stay.  Do not shave (including legs and underarms)  for at least 48 hours prior to the first CHG shower.  You may shave your face.  Please follow these instructions carefully:   1.  Shower with CHG Soap the night before surgery and the morning of Surgery.  2.  If you choose to wash your hair, wash your hair first as usual with your normal shampoo.  3.  After you shampoo, rinse your hair and body thoroughly to remove the Shampoo.  4.  Use CHG as you would any other liquid soap.  You can apply chg directly  to the skin and wash gently with scrungie or a clean washcloth.  5.  Apply the CHG Soap to your body ONLY FROM THE NECK DOWN.  Do not use on open wounds or open sores.  Avoid contact with your eyes ears, mouth and genitals (private parts).  Wash genitals (private parts)       with your normal soap.  6.  Wash thoroughly, paying special attention to the area where your surgery will be performed.  7.  Thoroughly rinse your body with warm water from the neck down.  8.  DO  NOT shower/wash with your normal soap after using and rinsing off the CHG Soap.  9.  Pat yourself dry with a clean towel.            10.  Wear clean pajamas.            11.  Place clean sheets on your bed the night of your first shower and do not sleep with pets.  Day of Surgery  Do not apply any lotions/deodorants the morning of surgery.  Please wear clean clothes to the hospital/surgery center.   Please read over the following fact sheets that you were given: Pain Booklet, Coughing and Deep Breathing, Blood Transfusion Information, Total Joint Packet, MRSA Information and Surgical Site Infection Prevention

## 2014-06-16 NOTE — Progress Notes (Signed)
   06/16/14 1422  OBSTRUCTIVE SLEEP APNEA  Have you ever been diagnosed with sleep apnea through a sleep study? No  Do you snore loudly (loud enough to be heard through closed doors)?  0  Do you often feel tired, fatigued, or sleepy during the daytime? 0  Has anyone observed you stop breathing during your sleep? 0  Do you have, or are you being treated for high blood pressure? 1  BMI more than 35 kg/m2? 1  Age over 45 years old? 1  Neck circumference greater than 40 cm/16 inches? 1 (19.5)  Gender: 1  Obstructive Sleep Apnea Score 4

## 2014-06-17 NOTE — Progress Notes (Signed)
Anesthesia Chart Review:  Pt is 44 year old male scheduled for L total hip arthroplasty on 06/28/2014 with Dr. Turner Danielsowan.   PMH includes: HTN, PVD, DVT, antiphospholipid antibody syndrome, stroke (2014). Current smoker. BMI 40. S/p R THA 07/17/13.   Medications include: coumadin. Pt is stopping coumadin 06/23/14.  Preoperative labs reviewed.  PTT 43. PT/INR 23.7/2.1. Pt still taking coumadin. Will recheck DOS.   Chest x-ray reviewed. No active cardiopulmonary disease.   EKG: NSR. Cannot rule out Anterior infarct, age undetermined. Unchanged from previous tracing in 2015.   If no changes, I anticipate pt can proceed with surgery as scheduled.   Rica Mastngela Kabbe, FNP-BC [facility]/Anesthesiology Phone: [phone] 06/17/2014 5:01 PM

## 2014-06-25 DIAGNOSIS — M1612 Unilateral primary osteoarthritis, left hip: Secondary | ICD-10-CM | POA: Diagnosis present

## 2014-06-25 MED ORDER — SODIUM CHLORIDE 0.9 % IV SOLN
2000.0000 mg | Freq: Once | INTRAVENOUS | Status: DC
  Filled 2014-06-25: qty 20

## 2014-06-25 NOTE — H&P (Signed)
TOTAL HIP ADMISSION H&P  Patient is admitted for left total hip arthroplasty.  Subjective:  Chief Complaint: left hip pain  HPI: Dominic Walters, 44 y.o. male, has a history of pain and functional disability in the left hip(s) due to arthritis and patient has failed non-surgical conservative treatments for greater than 12 weeks to include NSAID's and/or analgesics, flexibility and strengthening excercises, supervised PT with diminished ADL's post treatment, use of assistive devices, weight reduction as appropriate and activity modification.  Onset of symptoms was gradual starting several years ago with gradually worsening course since that time.The patient noted no past surgery on the left hip(s).  Patient currently rates pain in the left hip at 10 out of 10 with activity. Patient has night pain, worsening of pain with activity and weight bearing, pain that interfers with activities of daily living, pain with passive range of motion and crepitus. 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 This condition presents safety issues increasing the risk of falls. This patient has had AVN.  There is no current active infection.  Patient Active Problem List   Diagnosis Date Noted  . Arthritis of right hip 07/16/2013  . Right hip pain 05/26/2013  . Neuropathic pain, arm 05/20/2013  . Antiphospholipid antibody syndrome 03/10/2013  . 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 Blood clotting disorder 02/13/2013  . Chronic low back pain 12/16/2012  . Deep vein thrombosis, upper right extremity 12/16/2012  . High risk medication use 12/16/2012  . Anxiety state, unspecified 12/16/2012   Past Medical History  Diagnosis Date  . Hypertension   . Back pain, chronic     Herniated L 3, 4, 5.  . DVT (deep venous thrombosis)   . Antiphospholipid antibody syndrome   . Blood in urine   . Stroke 11/04/12    residual numbness rt hand-due to blood clot rt arm  . Peripheral vascular disease     rt arm  clot  . Blood dyscrasia     Past Surgical History  Procedure Laterality Date  . Carpal tunnel release Bilateral   . Thrombectomy      right arm  . Toe surgery Left     great toe fx pinned  . Total hip arthroplasty Right 07/17/2013    Procedure: TOTAL HIP ARTHROPLASTY;  Surgeon: Nestor LewandowskyFrank J Rowan, MD;  Location: [facility] OR;  Service: Orthopedics;  Laterality: Right;    No prescriptions prior to admission   Allergies  Allergen Reactions  . Vicodin [Hydrocodone-Acetaminophen] Itching  . Ivp Dye [Iodinated Diagnostic Agents] Rash    History  Substance Use Topics  . Smoking status: Current Every Day Smoker -- 0.50 packs/day for 25 years    Types: Cigarettes  . Smokeless tobacco: Not on file  . Alcohol Use: No    Family History  Problem Relation Age of Onset  . Arthritis Mother   . Hypertension Mother   . Diabetes Mother   . Hyperlipidemia Mother   . Cancer Father     lung  . Heart disease Father   . Diabetes Father   . Hyperlipidemia Father   . Hypertension Father   . Heart attack Father   . Diabetes Maternal Grandmother   . Hypertension Maternal Grandmother   . Heart disease Maternal Grandmother   . Hyperlipidemia Sister   . Hypertension Sister      Review of Systems  Constitutional: Positive for malaise/fatigue.  HENT: Negative.   Eyes: Negative.   Respiratory: Negative.   Cardiovascular:  Htn and hx of blood clots.  Gastrointestinal: Negative.   Genitourinary: Negative.   Musculoskeletal: Positive for joint pain.  Skin: Negative.   Neurological: Negative.   Endo/Heme/Allergies: Negative.   Psychiatric/Behavioral: Negative.     Objective:  Physical Exam  Constitutional: He appears well-developed and well-nourished.  HENT:  Head: Normocephalic and atraumatic.  Eyes: Pupils are equal, round, and reactive to light.  Neck: Normal range of motion. Neck supple.  Cardiovascular: Intact distal pulses.   Respiratory: Effort normal.  Musculoskeletal: He exhibits  tenderness.  the patient has minimal pain in the right hip with flexion extension internal/external rotation or log roll.  Mild tenderness in the right groin.  Patient's left hip does have increased pain with internal rotation.  Again mild pain with palpation over the groin.  He'll tap is negative bilaterally.  He has brisk capillary refill and is neurovascularly intact distally  Skin: Skin is warm and dry.  Psychiatric: He has a normal mood and affect. His behavior is normal. Judgment and thought content normal.    Vital signs in last 24 hours:    Labs:   Estimated body mass index is 35.66 kg/(m^2) as calculated from the following:   Height as of 07/18/13:  (1.854 m).   Weight as of 07/14/13: 122.562 kg (270 lb 3.2 oz).   Imaging Review Plain radiographs demonstrate AP of the pelvis and one view of the left and right hips are taken and reviewed in office today.  Patient's right total hip arthroplasty appears be well-placed and well fixed.  Patient's left hip does continue to have a high neck shaft angle.  The femoral head appears unchanged from his previous x-rays on 12/12/13.  Assessment/Plan:  End stage arthritis, left hip(s)  The patient history, physical examination, clinical judgement of the provider and imaging studies are consistent with end stage degenerative joint disease of the left hip(s) and total hip arthroplasty is deemed medically necessary. The treatment options including medical management, injection therapy, arthroscopy and arthroplasty were discussed at length. The risks and benefits of total hip arthroplasty were presented and reviewed. The risks due to aseptic loosening, infection, stiffness, dislocation/subluxation,  thromboembolic complications and other imponderables were discussed.  The patient acknowledged the explanation, agreed to proceed with the plan and consent was signed. Patient is being admitted for inpatient treatment for surgery, pain control, PT, OT,  prophylactic antibiotics, VTE prophylaxis, progressive ambulation and ADL's and discharge planning.The patient is planning to be discharged home with home health services

## 2014-06-27 MED ORDER — DEXTROSE 5 % IV SOLN
3.0000 g | INTRAVENOUS | Status: AC
  Administered 2014-06-28: 3 g via INTRAVENOUS
  Filled 2014-06-27: qty 3000

## 2014-06-27 NOTE — Anesthesia Preprocedure Evaluation (Addendum)
Anesthesia Evaluation  Patient identified by MRN, date of birth, ID band Patient awake    Reviewed: NPO status , Patient's Chart, lab work & pertinent test results, reviewed documented beta blocker date and time   Airway Mallampati: II   Neck ROM: Full    Dental  (+) Chipped   Pulmonary Current Smoker (12 pack year),  breath sounds clear to auscultation        Cardiovascular hypertension, Pt. on medications + Peripheral Vascular Disease and DVT Rhythm:Regular  EKG possible old ant MI   Neuro/Psych Anxiety    GI/Hepatic negative GI ROS, Neg liver ROS,   Endo/Other  Morbid obesity  Renal/GU Creat 1.22     Musculoskeletal  (+) Arthritis -,   Abdominal (+) + obese,   Peds  Hematology Anti phospholipid   Anesthesia Other Findings chipped front bottom  Reproductive/Obstetrics                            Anesthesia Physical Anesthesia Plan  ASA: III  Anesthesia Plan: Spinal   Post-op Pain Management:    Induction:   Airway Management Planned: Natural Airway  Additional Equipment:   Intra-op Plan:   Post-operative Plan:   Informed Consent: I have reviewed the patients History and Physical, chart, labs and discussed the procedure including the risks, benefits and alternatives for the proposed anesthesia with the patient or authorized representative who has indicated his/her understanding and acceptance.     Plan Discussed with:   Anesthesia Plan Comments: (Need to check am clotting results, stopped coumadin, ask if still taking Lovenox, if no Lovenox and clotting OK then spinal)        Anesthesia Quick Evaluation

## 2014-06-28 ENCOUNTER — Inpatient Hospital Stay (HOSPITAL_COMMUNITY)
Admission: RE | Admit: 2014-06-28 | Discharge: 2014-06-29 | DRG: 470 | Disposition: A | Discharge status: Home Health | Payer: BLUE CROSS/BLUE SHIELD | Source: Ambulatory Visit | Attending: Orthopedic Surgery | Admitting: Orthopedic Surgery

## 2014-06-28 ENCOUNTER — Encounter (HOSPITAL_COMMUNITY): Payer: Self-pay | Admitting: *Deleted

## 2014-06-28 ENCOUNTER — Inpatient Hospital Stay (HOSPITAL_COMMUNITY): Payer: BLUE CROSS/BLUE SHIELD

## 2014-06-28 ENCOUNTER — Inpatient Hospital Stay (HOSPITAL_COMMUNITY): Payer: BLUE CROSS/BLUE SHIELD | Admitting: Anesthesiology

## 2014-06-28 ENCOUNTER — Encounter (HOSPITAL_COMMUNITY)
Admission: RE | Disposition: A | Discharge status: Home Health | Payer: Self-pay | Source: Ambulatory Visit | Attending: Orthopedic Surgery

## 2014-06-28 ENCOUNTER — Inpatient Hospital Stay (HOSPITAL_COMMUNITY): Payer: BLUE CROSS/BLUE SHIELD | Admitting: Emergency Medicine

## 2014-06-28 DIAGNOSIS — I10 Essential (primary) hypertension: Secondary | ICD-10-CM | POA: Diagnosis present

## 2014-06-28 DIAGNOSIS — D6861 Antiphospholipid syndrome: Secondary | ICD-10-CM | POA: Diagnosis present

## 2014-06-28 DIAGNOSIS — M1610 Unilateral primary osteoarthritis, unspecified hip: Secondary | ICD-10-CM | POA: Diagnosis present

## 2014-06-28 DIAGNOSIS — Z96641 Presence of right artificial hip joint: Secondary | ICD-10-CM | POA: Diagnosis present

## 2014-06-28 DIAGNOSIS — Z8673 Personal history of transient ischemic attack (TIA), and cerebral infarction without residual deficits: Secondary | ICD-10-CM | POA: Diagnosis not present

## 2014-06-28 DIAGNOSIS — F17210 Nicotine dependence, cigarettes, uncomplicated: Secondary | ICD-10-CM | POA: Diagnosis present

## 2014-06-28 DIAGNOSIS — F411 Generalized anxiety disorder: Secondary | ICD-10-CM | POA: Diagnosis present

## 2014-06-28 DIAGNOSIS — Z86718 Personal history of other venous thrombosis and embolism: Secondary | ICD-10-CM | POA: Diagnosis not present

## 2014-06-28 DIAGNOSIS — G8929 Other chronic pain: Secondary | ICD-10-CM | POA: Diagnosis present

## 2014-06-28 DIAGNOSIS — M879 Osteonecrosis, unspecified: Secondary | ICD-10-CM | POA: Diagnosis present

## 2014-06-28 DIAGNOSIS — Z96649 Presence of unspecified artificial hip joint: Secondary | ICD-10-CM

## 2014-06-28 DIAGNOSIS — M1612 Unilateral primary osteoarthritis, left hip: Secondary | ICD-10-CM | POA: Diagnosis present

## 2014-06-28 DIAGNOSIS — M25552 Pain in left hip: Secondary | ICD-10-CM | POA: Diagnosis present

## 2014-06-28 DIAGNOSIS — M1611 Unilateral primary osteoarthritis, right hip: Secondary | ICD-10-CM

## 2014-06-28 DIAGNOSIS — I739 Peripheral vascular disease, unspecified: Secondary | ICD-10-CM | POA: Diagnosis present

## 2014-06-28 DIAGNOSIS — M545 Low back pain: Secondary | ICD-10-CM | POA: Diagnosis present

## 2014-06-28 HISTORY — PX: TOTAL HIP ARTHROPLASTY: SHX124

## 2014-06-28 LAB — CBC
HCT: 43.1 % (ref 39.0–52.0)
HEMOGLOBIN: 14 g/dL (ref 13.0–17.0)
MCH: 29.4 pg (ref 26.0–34.0)
MCHC: 32.5 g/dL (ref 30.0–36.0)
MCV: 90.5 fL (ref 78.0–100.0)
PLATELETS: 230 10*3/uL (ref 150–400)
RBC: 4.76 MIL/uL (ref 4.22–5.81)
RDW: 14.8 % (ref 11.5–15.5)
WBC: 15.7 10*3/uL — AB (ref 4.0–10.5)

## 2014-06-28 LAB — CREATININE, SERUM
CREATININE: 1.19 mg/dL (ref 0.61–1.24)
GFR calc Af Amer: >60 mL/min (ref >60)
GFR calc non Af Amer: >60 mL/min (ref >60)

## 2014-06-28 LAB — PROTIME-INR
INR: 1.09 (ref 0.00–1.49)
PROTHROMBIN TIME: 14.3 s (ref 11.6–15.2)

## 2014-06-28 LAB — APTT: aPTT: 32 seconds (ref 24–37)

## 2014-06-28 SURGERY — ARTHROPLASTY, HIP, TOTAL,POSTERIOR APPROACH
Anesthesia: Spinal | Site: Hip | Laterality: Left

## 2014-06-28 MED ORDER — EPHEDRINE SULFATE 50 MG/ML IJ SOLN
INTRAMUSCULAR | Status: AC
  Filled 2014-06-28: qty 1

## 2014-06-28 MED ORDER — ONDANSETRON HCL 4 MG/2ML IJ SOLN: 4.0000 mg | Freq: Four times a day (QID) | INTRAMUSCULAR | Status: DC | PRN

## 2014-06-28 MED ORDER — METHOCARBAMOL 500 MG PO TABS: 500.0000 mg | ORAL_TABLET | Freq: Two times a day (BID) | ORAL | Status: DC

## 2014-06-28 MED ORDER — ACETAMINOPHEN 325 MG PO TABS: 650.0000 mg | ORAL_TABLET | Freq: Four times a day (QID) | ORAL | Status: DC | PRN

## 2014-06-28 MED ORDER — ONDANSETRON HCL 4 MG/2ML IJ SOLN
INTRAMUSCULAR | Status: AC
  Filled 2014-06-28: qty 2

## 2014-06-28 MED ORDER — LIDOCAINE HCL (CARDIAC) 20 MG/ML IV SOLN
INTRAVENOUS | Status: AC
  Filled 2014-06-28: qty 5

## 2014-06-28 MED ORDER — DEXTROSE-NACL 5-0.45 % IV SOLN: INTRAVENOUS | Status: DC

## 2014-06-28 MED ORDER — NEOSTIGMINE METHYLSULFATE 10 MG/10ML IV SOLN
INTRAVENOUS | Status: AC
  Filled 2014-06-28: qty 1

## 2014-06-28 MED ORDER — SENNOSIDES-DOCUSATE SODIUM 8.6-50 MG PO TABS: 1.0000 | ORAL_TABLET | Freq: Every evening | ORAL | Status: DC | PRN

## 2014-06-28 MED ORDER — PROPOFOL 10 MG/ML IV BOLUS
INTRAVENOUS | Status: AC
  Filled 2014-06-28: qty 20

## 2014-06-28 MED ORDER — ONDANSETRON HCL 4 MG/2ML IJ SOLN
INTRAMUSCULAR | Status: DC | PRN
  Administered 2014-06-28: 4 mg via INTRAVENOUS

## 2014-06-28 MED ORDER — LISINOPRIL 20 MG PO TABS
20.0000 mg | ORAL_TABLET | Freq: Every day | ORAL | Status: DC
  Administered 2014-06-28 – 2014-06-29 (×2): 20 mg via ORAL
  Filled 2014-06-28 (×2): qty 1

## 2014-06-28 MED ORDER — OXYCODONE HCL 5 MG PO TABS
5.0000 mg | ORAL_TABLET | ORAL | Status: DC | PRN
  Administered 2014-06-28 (×2): 10 mg via ORAL
  Administered 2014-06-28: 5 mg via ORAL
  Administered 2014-06-29 (×2): 10 mg via ORAL
  Filled 2014-06-28 (×4): qty 2

## 2014-06-28 MED ORDER — METHOCARBAMOL 1000 MG/10ML IJ SOLN
500.0000 mg | Freq: Four times a day (QID) | INTRAVENOUS | Status: DC | PRN
  Filled 2014-06-28: qty 5

## 2014-06-28 MED ORDER — FENTANYL CITRATE (PF) 100 MCG/2ML IJ SOLN
INTRAMUSCULAR | Status: DC | PRN
  Administered 2014-06-28: 100 ug via INTRAVENOUS
  Administered 2014-06-28: 150 ug via INTRAVENOUS
  Administered 2014-06-28: 50 ug via INTRAVENOUS

## 2014-06-28 MED ORDER — SODIUM CHLORIDE 0.9 % IR SOLN
Status: DC | PRN
  Administered 2014-06-28: 1000 mL

## 2014-06-28 MED ORDER — DOCUSATE SODIUM 100 MG PO CAPS
100.0000 mg | ORAL_CAPSULE | Freq: Two times a day (BID) | ORAL | Status: DC
  Administered 2014-06-28 – 2014-06-29 (×2): 100 mg via ORAL
  Filled 2014-06-28 (×2): qty 1

## 2014-06-28 MED ORDER — FENTANYL CITRATE (PF) 100 MCG/2ML IJ SOLN
INTRAMUSCULAR | Status: AC
  Filled 2014-06-28: qty 2

## 2014-06-28 MED ORDER — BUPIVACAINE-EPINEPHRINE (PF) 0.5% -1:200000 IJ SOLN
INTRAMUSCULAR | Status: AC
  Filled 2014-06-28: qty 30

## 2014-06-28 MED ORDER — BISACODYL 5 MG PO TBEC: 5.0000 mg | DELAYED_RELEASE_TABLET | Freq: Every day | ORAL | Status: DC | PRN

## 2014-06-28 MED ORDER — METOCLOPRAMIDE HCL 5 MG/ML IJ SOLN: 5.0000 mg | Freq: Three times a day (TID) | INTRAMUSCULAR | Status: DC | PRN

## 2014-06-28 MED ORDER — ALUMINUM HYDROXIDE GEL 320 MG/5ML PO SUSP
15.0000 mL | ORAL | Status: DC | PRN
  Filled 2014-06-28: qty 30

## 2014-06-28 MED ORDER — LACTATED RINGERS IV SOLN
INTRAVENOUS | Status: DC | PRN
  Administered 2014-06-28 (×2): via INTRAVENOUS

## 2014-06-28 MED ORDER — ACETAMINOPHEN 650 MG RE SUPP: 650.0000 mg | Freq: Four times a day (QID) | RECTAL | Status: DC | PRN

## 2014-06-28 MED ORDER — GLYCOPYRROLATE 0.2 MG/ML IJ SOLN
INTRAMUSCULAR | Status: DC | PRN
  Administered 2014-06-28: .5 mg via INTRAVENOUS

## 2014-06-28 MED ORDER — HYDROMORPHONE HCL 1 MG/ML IJ SOLN
0.5000 mg | INTRAMUSCULAR | Status: DC | PRN
  Administered 2014-06-28 – 2014-06-29 (×6): 1 mg via INTRAVENOUS
  Filled 2014-06-28 (×6): qty 1

## 2014-06-28 MED ORDER — TRANEXAMIC ACID 1000 MG/10ML IV SOLN
2000.0000 mg | INTRAVENOUS | Status: DC | PRN
  Administered 2014-06-28: 2000 mg via INTRAVENOUS

## 2014-06-28 MED ORDER — DEXMEDETOMIDINE HCL 200 MCG/2ML IV SOLN
INTRAVENOUS | Status: DC | PRN
  Administered 2014-06-28: 4 ug via INTRAVENOUS
  Administered 2014-06-28: 8 ug via INTRAVENOUS
  Administered 2014-06-28: 4 ug via INTRAVENOUS

## 2014-06-28 MED ORDER — OXYCODONE HCL 5 MG PO TABS
ORAL_TABLET | ORAL | Status: AC
  Filled 2014-06-28: qty 1

## 2014-06-28 MED ORDER — PROPOFOL 10 MG/ML IV BOLUS
INTRAVENOUS | Status: DC | PRN
  Administered 2014-06-28: 200 mg via INTRAVENOUS

## 2014-06-28 MED ORDER — DIPHENHYDRAMINE HCL 12.5 MG/5ML PO ELIX: 12.5000 mg | ORAL_SOLUTION | ORAL | Status: DC | PRN

## 2014-06-28 MED ORDER — OXYCODONE-ACETAMINOPHEN 5-325 MG PO TABS: 1.0000 | ORAL_TABLET | Freq: Four times a day (QID) | ORAL | Status: DC | PRN

## 2014-06-28 MED ORDER — MENTHOL 3 MG MT LOZG: 1.0000 | LOZENGE | OROMUCOSAL | Status: DC | PRN

## 2014-06-28 MED ORDER — SODIUM CHLORIDE 0.9 % IJ SOLN
INTRAMUSCULAR | Status: AC
  Filled 2014-06-28: qty 10

## 2014-06-28 MED ORDER — ROCURONIUM BROMIDE 100 MG/10ML IV SOLN
INTRAVENOUS | Status: DC | PRN
  Administered 2014-06-28: 10 mg via INTRAVENOUS
  Administered 2014-06-28: 30 mg via INTRAVENOUS

## 2014-06-28 MED ORDER — ENOXAPARIN SODIUM 30 MG/0.3ML ~~LOC~~ SOLN
30.0000 mg | Freq: Two times a day (BID) | SUBCUTANEOUS | Status: DC
Start: 2014-06-29 — End: 2014-06-29
  Administered 2014-06-29: 30 mg via SUBCUTANEOUS
  Filled 2014-06-28: qty 0.3

## 2014-06-28 MED ORDER — SUCCINYLCHOLINE CHLORIDE 20 MG/ML IJ SOLN
INTRAMUSCULAR | Status: AC
  Filled 2014-06-28: qty 1

## 2014-06-28 MED ORDER — METHOCARBAMOL 500 MG PO TABS
500.0000 mg | ORAL_TABLET | Freq: Four times a day (QID) | ORAL | Status: DC | PRN
  Administered 2014-06-28 – 2014-06-29 (×3): 500 mg via ORAL
  Filled 2014-06-28 (×3): qty 1

## 2014-06-28 MED ORDER — NEOSTIGMINE METHYLSULFATE 10 MG/10ML IV SOLN
INTRAVENOUS | Status: DC | PRN
  Administered 2014-06-28: 5 mg via INTRAVENOUS

## 2014-06-28 MED ORDER — PROMETHAZINE HCL 25 MG/ML IJ SOLN
6.2500 mg | INTRAMUSCULAR | Status: DC | PRN
Start: 2014-06-28 — End: 2014-06-28

## 2014-06-28 MED ORDER — ROCURONIUM BROMIDE 50 MG/5ML IV SOLN
INTRAVENOUS | Status: AC
  Filled 2014-06-28: qty 1

## 2014-06-28 MED ORDER — PHENOL 1.4 % MT LIQD
1.0000 | OROMUCOSAL | Status: DC | PRN
Start: 2014-06-28 — End: 2014-06-29

## 2014-06-28 MED ORDER — ATORVASTATIN CALCIUM 40 MG PO TABS
40.0000 mg | ORAL_TABLET | Freq: Every day | ORAL | Status: DC
  Administered 2014-06-28 – 2014-06-29 (×2): 40 mg via ORAL
  Filled 2014-06-28 (×2): qty 1

## 2014-06-28 MED ORDER — MEPERIDINE HCL 25 MG/ML IJ SOLN: 6.2500 mg | INTRAMUSCULAR | Status: DC | PRN

## 2014-06-28 MED ORDER — METOCLOPRAMIDE HCL 5 MG PO TABS: 5.0000 mg | ORAL_TABLET | Freq: Three times a day (TID) | ORAL | Status: DC | PRN

## 2014-06-28 MED ORDER — FENTANYL CITRATE (PF) 250 MCG/5ML IJ SOLN
INTRAMUSCULAR | Status: AC
  Filled 2014-06-28: qty 5

## 2014-06-28 MED ORDER — GLYCOPYRROLATE 0.2 MG/ML IJ SOLN
INTRAMUSCULAR | Status: AC
  Filled 2014-06-28: qty 2

## 2014-06-28 MED ORDER — DEXTROSE 5 % IV SOLN
500.0000 mg | INTRAVENOUS | Status: AC
  Administered 2014-06-28: 500 mg via INTRAVENOUS
  Filled 2014-06-28: qty 5

## 2014-06-28 MED ORDER — SUCCINYLCHOLINE CHLORIDE 20 MG/ML IJ SOLN
INTRAMUSCULAR | Status: DC | PRN
  Administered 2014-06-28: 140 mg via INTRAVENOUS

## 2014-06-28 MED ORDER — PHENYLEPHRINE HCL 10 MG/ML IJ SOLN
INTRAMUSCULAR | Status: DC | PRN
  Administered 2014-06-28 (×2): 80 ug via INTRAVENOUS

## 2014-06-28 MED ORDER — PNEUMOCOCCAL VAC POLYVALENT 25 MCG/0.5ML IJ INJ
0.5000 mL | INJECTION | INTRAMUSCULAR | Status: AC
  Administered 2014-06-29: 0.5 mL via INTRAMUSCULAR
  Filled 2014-06-28: qty 0.5

## 2014-06-28 MED ORDER — KCL IN DEXTROSE-NACL 20-5-0.45 MEQ/L-%-% IV SOLN
INTRAVENOUS | Status: DC
  Administered 2014-06-28: 23:00:00 via INTRAVENOUS
  Administered 2014-06-28: 125 mL/h via INTRAVENOUS
  Filled 2014-06-28 (×6): qty 1000

## 2014-06-28 MED ORDER — DEXMEDETOMIDINE HCL IN NACL 200 MCG/50ML IV SOLN
INTRAVENOUS | Status: AC
  Filled 2014-06-28: qty 50

## 2014-06-28 MED ORDER — ASPIRIN EC 325 MG PO TBEC
325.0000 mg | DELAYED_RELEASE_TABLET | Freq: Every day | ORAL | Status: DC
  Administered 2014-06-29: 325 mg via ORAL
  Filled 2014-06-28: qty 1

## 2014-06-28 MED ORDER — LIDOCAINE HCL (CARDIAC) 20 MG/ML IV SOLN
INTRAVENOUS | Status: DC | PRN
  Administered 2014-06-28: 100 mg via INTRAVENOUS

## 2014-06-28 MED ORDER — CHLORHEXIDINE GLUCONATE 4 % EX LIQD
60.0000 mL | Freq: Once | CUTANEOUS | Status: DC
  Filled 2014-06-28: qty 60

## 2014-06-28 MED ORDER — FLEET ENEMA 7-19 GM/118ML RE ENEM: 1.0000 | ENEMA | Freq: Once | RECTAL | Status: AC | PRN

## 2014-06-28 MED ORDER — ONDANSETRON HCL 4 MG PO TABS: 4.0000 mg | ORAL_TABLET | Freq: Four times a day (QID) | ORAL | Status: DC | PRN

## 2014-06-28 MED ORDER — BUPIVACAINE-EPINEPHRINE 0.5% -1:200000 IJ SOLN
INTRAMUSCULAR | Status: DC | PRN
  Administered 2014-06-28: 30 mL

## 2014-06-28 MED ORDER — MIDAZOLAM HCL 2 MG/2ML IJ SOLN
INTRAMUSCULAR | Status: AC
  Filled 2014-06-28: qty 2

## 2014-06-28 MED ORDER — MIDAZOLAM HCL 5 MG/5ML IJ SOLN
INTRAMUSCULAR | Status: DC | PRN
  Administered 2014-06-28: 2 mg via INTRAVENOUS

## 2014-06-28 MED ORDER — ENOXAPARIN SODIUM 120 MG/0.8ML ~~LOC~~ SOLN: 120.0000 mg | Freq: Two times a day (BID) | SUBCUTANEOUS | Status: AC

## 2014-06-28 MED ORDER — FENTANYL CITRATE (PF) 100 MCG/2ML IJ SOLN
25.0000 ug | INTRAMUSCULAR | Status: DC | PRN
  Administered 2014-06-28 (×4): 25 ug via INTRAVENOUS

## 2014-06-28 MED ORDER — KCL IN DEXTROSE-NACL 20-5-0.45 MEQ/L-%-% IV SOLN
INTRAVENOUS | Status: AC
  Filled 2014-06-28: qty 1000

## 2014-06-28 MED ORDER — DEXAMETHASONE SODIUM PHOSPHATE 10 MG/ML IJ SOLN
10.0000 mg | Freq: Once | INTRAMUSCULAR | Status: AC
  Administered 2014-06-29: 10 mg via INTRAVENOUS
  Filled 2014-06-28: qty 1

## 2014-06-28 SURGICAL SUPPLY — 54 items
BLADE SAW SGTL 18X1.27X75 (BLADE) ×2 IMPLANT
BLADE SAW SGTL 18X1.27X75MM (BLADE) ×1
BRUSH FEMORAL CANAL (MISCELLANEOUS) IMPLANT
CAPT HIP TOTAL 2 ×3 IMPLANT
COVER BACK TABLE 24X17X13 BIG (DRAPES) IMPLANT
COVER SURGICAL LIGHT HANDLE (MISCELLANEOUS) ×6 IMPLANT
DRAPE IMP U-DRAPE 54X76 (DRAPES) ×3 IMPLANT
DRAPE ORTHO SPLIT 77X108 STRL (DRAPES) ×3
DRAPE PROXIMA HALF (DRAPES) ×3 IMPLANT
DRAPE SURG ORHT 6 SPLT 77X108 (DRAPES) ×1 IMPLANT
DRAPE U-SHAPE 47X51 STRL (DRAPES) ×3 IMPLANT
DRILL BIT 7/64X5 (BIT) ×3 IMPLANT
DRSG AQUACEL AG ADV 3.5X10 (GAUZE/BANDAGES/DRESSINGS) ×3 IMPLANT
DURAPREP 26ML APPLICATOR (WOUND CARE) ×3 IMPLANT
ELECT BLADE 4.0 EZ CLEAN MEGAD (MISCELLANEOUS)
ELECT REM PT RETURN 9FT ADLT (ELECTROSURGICAL) ×3
ELECTRODE BLDE 4.0 EZ CLN MEGD (MISCELLANEOUS) IMPLANT
ELECTRODE REM PT RTRN 9FT ADLT (ELECTROSURGICAL) ×1 IMPLANT
GLOVE BIO SURGEON STRL SZ7.5 (GLOVE) ×3 IMPLANT
GLOVE BIO SURGEON STRL SZ8.5 (GLOVE) ×6 IMPLANT
GLOVE BIOGEL PI IND STRL 8 (GLOVE) ×2 IMPLANT
GLOVE BIOGEL PI IND STRL 9 (GLOVE) ×1 IMPLANT
GLOVE BIOGEL PI INDICATOR 8 (GLOVE) ×4
GLOVE BIOGEL PI INDICATOR 9 (GLOVE) ×2
GOWN STRL REUS W/ TWL LRG LVL3 (GOWN DISPOSABLE) ×2 IMPLANT
GOWN STRL REUS W/ TWL XL LVL3 (GOWN DISPOSABLE) ×3 IMPLANT
GOWN STRL REUS W/TWL LRG LVL3 (GOWN DISPOSABLE) ×6
GOWN STRL REUS W/TWL XL LVL3 (GOWN DISPOSABLE) ×9
HANDPIECE INTERPULSE COAX TIP (DISPOSABLE)
HOOD PEEL AWAY FACE SHEILD DIS (HOOD) ×6 IMPLANT
KIT BASIN OR (CUSTOM PROCEDURE TRAY) ×3 IMPLANT
KIT ROOM TURNOVER OR (KITS) ×3 IMPLANT
MANIFOLD NEPTUNE II (INSTRUMENTS) ×3 IMPLANT
NEEDLE 22X1 1/2 (OR ONLY) (NEEDLE) ×3 IMPLANT
NS IRRIG 1000ML POUR BTL (IV SOLUTION) ×3 IMPLANT
PACK TOTAL JOINT (CUSTOM PROCEDURE TRAY) ×3 IMPLANT
PACK UNIVERSAL I (CUSTOM PROCEDURE TRAY) ×3 IMPLANT
PAD ARMBOARD 7.5X6 YLW CONV (MISCELLANEOUS) ×6 IMPLANT
PASSER SUT SWANSON 36MM LOOP (INSTRUMENTS) ×3 IMPLANT
PRESSURIZER FEMORAL UNIV (MISCELLANEOUS) IMPLANT
SET HNDPC FAN SPRY TIP SCT (DISPOSABLE) IMPLANT
SUT ETHIBOND 2 V 37 (SUTURE) ×3 IMPLANT
SUT VIC AB 0 CTB1 27 (SUTURE) ×3 IMPLANT
SUT VIC AB 1 CTX 36 (SUTURE) ×3
SUT VIC AB 1 CTX36XBRD ANBCTR (SUTURE) ×1 IMPLANT
SUT VIC AB 2-0 CTB1 (SUTURE) ×3 IMPLANT
SUT VIC AB 3-0 SH 27 (SUTURE) ×3
SUT VIC AB 3-0 SH 27X BRD (SUTURE) ×1 IMPLANT
SYR CONTROL 10ML LL (SYRINGE) ×3 IMPLANT
TOWEL OR 17X24 6PK STRL BLUE (TOWEL DISPOSABLE) ×3 IMPLANT
TOWEL OR 17X26 10 PK STRL BLUE (TOWEL DISPOSABLE) ×3 IMPLANT
TOWER CARTRIDGE SMART MIX (DISPOSABLE) IMPLANT
TRAY FOLEY CATH 14FR (SET/KITS/TRAYS/PACK) IMPLANT
WATER STERILE IRR 1000ML POUR (IV SOLUTION) ×12 IMPLANT

## 2014-06-28 NOTE — Interval H&P Note (Signed)
History and Physical Interval Note:  06/28/2014 7:08 AM  Dominic EldersAllen R Dumond  has presented today for surgery, with the diagnosis of LEFT HIP AVASCULAR NECROSIS  The various methods of treatment have been discussed with the patient and family. After consideration of risks, benefits and other options for treatment, the patient has consented to  Procedure(s): TOTAL HIP ARTHROPLASTY (Left) as a surgical intervention .  The patient's history has been reviewed, patient examined, no change in status, stable for surgery.  I have reviewed the patient's chart and labs.  Questions were answered to the patient's satisfaction.     Nestor LewandowskyOWAN,Silvino Selman J

## 2014-06-28 NOTE — Op Note (Signed)
OPERATIVE REPORT    DATE OF PROCEDURE:  06/28/2014       PREOPERATIVE DIAGNOSIS:  LEFT HIP AVASCULAR NECROSIS, DDH                                                         POSTOPERATIVE DIAGNOSIS:  LEFT HIP AVASCULAR NECROSIS, DDH                                                          PROCEDURE:  L total hip arthroplasty using a 52 mm DePuy Pinnacle  Cup, [employer], 10-degree polyethylene liner index superior  and posterior, a +3 36 mm ceramic head, a 18x13x42x150 SROM stem, 18Fsm Sleeve   SURGEON: Yeimi Debnam J    ASSISTANT:   Eric K. [employer]  (present throughout entire procedure and necessary for timely completion of the procedure)   ANESTHESIA: GET BLOOD LOSS: 300 FLUID REPLACEMENT: 1500 crystalloid Antibiotic: 2gm ancef Tranexamic Acid: 2gm topical COMPLICATIONS: none    INDICATIONS FOR PROCEDURE: A 44 y.o. year-old With  LEFT HIP AVASCULAR NECROSIS   for 3 years, x-rays show bone-on-bone arthritic changes. Despite conservative measures with observation, anti-inflammatory medicine, narcotics, use of a cane, has severe unremitting pain and can ambulate only a few blocks before resting.  Patient desires elective L total hip arthroplasty to decrease pain and increase function. The risks, benefits, and alternatives were discussed at length including but not limited to the risks of infection, bleeding, nerve injury, stiffness, blood clots, the need for revision surgery, cardiopulmonary complications, among others, and they were willing to proceed. Questions answered     PROCEDURE IN DETAIL: The patient was identified by armband,  received preoperative IV antibiotics in the holding area at [facility], taken to the operating room , appropriate anesthetic monitors  were attached and general endotracheal anesthesia induced. Pt was rolled into the R lateral decubitus position and fixed there with a Stulberg Mark II pelvic clamp.  The L lower extremity was then  prepped and draped  in the usual sterile fashion from the ankle to the hemipelvis. A time-out  procedure was performed. The skin along the lateral hip and thigh  infiltrated with 10 mL of 0.5% Marcaine and epinephrine solution. We  then made a posterolateral approach to the hip. With a #10 blade, a 18 cm  incision was made through the skin and subcutaneous tissue down to the level of the  IT band. Small bleeders were identified and cauterized. The IT band was cut in  line with skin incision exposing the greater trochanter. A Cobra retractor was placed between the gluteus minimus and the superior hip joint capsule, and a spiked Cobra between the quadratus femoris and the inferior hip joint capsule. This isolated the short  external rotators and piriformis tendons. These were tagged with a #2 Ethibond  suture and cut off their insertion on the intertrochanteric crest. The posterior  capsule was then developed into an acetabular-based flap from Posterior Superior off of the acetabulum out over the femoral neck and back posterior inferior to the acetabular rim. This flap was tagged with two #2 Ethibond  sutures and retracted protecting the sciatic nerve. This exposed the arthritic femoral head and osteophytes. The hip was then flexed and internally rotated, dislocating the femoral head and a standard neck cut performed 1 fingerbreadth above the lesser trochanter.  A spiked Cobra was placed in the cotyloid notch and a Hohmann retractor was then used to lever the femur anteriorly off of the anterior pelvic column. A posterior-inferior wing retractor was placed at the junction of the acetabulum and the ischium completing the acetabular exposure.We then removed the peripheral osteophytes and labrum from the acetabulum. We then reamed the acetabulum up to 51 mm with basket reamers obtaining good coverage in all quadrants. We then irrigated with normal  saline solution and hammered into place a 52 mm pinnacle cup  in 45  degrees of abduction and about 25 degrees of anteversion. More  peripheral osteophytes removed and a trial 10-degree liner placed with the  index superior-posterior. The hip was then flexed and internally rotated exposing the  proximal femur, which was entered with the initiating reamer followed by  the axial reamers up to a 13.5 mm full depth and 14mm partial depth. We then conically reamed to 77F to the correct depth for a 42 base neck. The calcar was milled to 77Fsm. A trial sleeve and stem was inserted in the 20 degrees anteversion, with a +3 36mm trial head. Trial reduction was then performed and excellent stability was noted with at 90 of flexion with 75 of internal rotation and then full extension with maximal external rotation. The hip could not be dislocated in full extension. The knee could easily flex  to about 130 degrees. We also stretched the abductors at this point,  because of the preexisting adductor contractures. All trial components  were then removed. The acetabulum was irrigated out with normal saline  solution. A titanium [facility] was then screwed into place  followed by a 10-degree polyethylene liner index superior-posterior. On  the femoral side a 77Fsm ZTT1 sleeve was hammered into place, followed by a 18x13x42x150 SROM stem in 25 degrees of anteversion. At this point, a +3 36 mm ceramic head was  hammered on the stem. The hip was reduced. We checked our stability  one more time and found it to be excellent. The wound was once again  thoroughly irrigated out with normal saline solution hand lavage. The  capsular flap and short external rotators were repaired back to the  intertrochanteric crest through drill holes with a #2 Ethibond suture.  The IT band was closed with running 1 Vicryl suture. The subcutaneous  tissue with 0 and 2-0 undyed Vicryl suture and the skin with running  3-0 vicryl subcuticular suture. Aquacil dressing was applied. The patient  was then unclamped, rolled supine, awaken extubated and taken to recovery room without difficulty in stable condition.   Jeter Tomey J 06/28/2014, 8:44 AM

## 2014-06-28 NOTE — Discharge Instructions (Signed)

## 2014-06-28 NOTE — Plan of Care (Signed)
Problem: Consults Goal: Diagnosis- Total Joint Replacement Primary Total Hip Left     

## 2014-06-28 NOTE — Evaluation (Signed)
Physical Therapy Evaluation Patient Details Name: Dominic Walters MRN: [id number] DOB: 03-15-1970 Today's Date: 06/28/2014   History of Present Illness  Pt is a [age] y/o M s/p L THA.  Pt's PMH includes R THA, HTN, chronic back pain, DVT, stroke in 2014 w/ residual numbness in R hand, and PVD.  Clinical Impression  Pt is s/p L THA resulting in the deficits listed below (see PT Problem List). Pt demonstrated ability to ambulate 120 ft this session.Pt will benefit from skilled PT to increase their independence and safety with mobility to allow discharge to the venue listed below.      Follow Up Recommendations Home health PT;Supervision for mobility/OOB    Equipment Recommendations  None recommended by PT    Recommendations for Other Services OT consult     Precautions / Restrictions Precautions Precautions: Posterior Hip;Fall Precaution Booklet Issued: Yes (comment) Precaution Comments: Reviewed post hip precautions Restrictions Weight Bearing Restrictions: Yes LLE Weight Bearing: Weight bearing as tolerated      Mobility  Bed Mobility Overal bed mobility: Needs Assistance Bed Mobility: Supine to Sit     Supine to sit: Min assist;HOB elevated     General bed mobility comments: Min A for managing LLE OOB, cues for sequencing, increased time, pt heavy use of bed rails  Transfers Overall transfer level: Needs assistance Equipment used: Rolling walker (2 wheeled) Transfers: Sit to/from Stand Sit to Stand: Min guard         General transfer comment: close min guard for stand>sit w/ cues for hand placement and reminder of WB status  Ambulation/Gait Ambulation/Gait assistance: Min guard Ambulation Distance (Feet): 120 Feet Assistive device: Rolling walker (2 wheeled) Gait Pattern/deviations: Step-to pattern;Decreased stride length;Decreased stance time - left;Antalgic;Trunk flexed;Decreased weight shift to left   Gait velocity interpretation: Below normal speed for  age/gender General Gait Details: dec L hip F, cues to stand upright and education on proper use of RW.  Fatigue of LLE after ambulating 120 ft  Stairs            Wheelchair Mobility    Modified Rankin (Stroke Patients Only)       Balance Overall balance assessment: Needs assistance Sitting-balance support: Bilateral upper extremity supported;Feet supported Sitting balance-Leahy Scale: Fair     Standing balance support: Bilateral upper extremity supported;During functional activity Standing balance-Leahy Scale: Fair                               Pertinent Vitals/Pain Pain Assessment: 0-10 Pain Score: 7  Pain Location: L hip down to L knee Pain Descriptors / Indicators: Throbbing Pain Intervention(s): Limited activity within patient's tolerance;Monitored during session;Premedicated before session;Repositioned    Home Living Family/patient expects to be discharged to:: Private residence Living Arrangements: Spouse/significant other;Children 65(14 y/o daughter and 398 y/o son) Available Help at Discharge: Family;Available 24 hours/day Type of Home: House Home Access: Stairs to enter Entrance Stairs-Rails: None Entrance Stairs-Number of Steps: 3 Home Layout: Two level;Able to live on main level with bedroom/bathroom Home Equipment: Dan HumphreysWalker - 2 wheels;Cane - single point;Bedside commode      Prior Function Level of Independence: Independent               Hand Dominance   Dominant Hand: Left    Extremity/Trunk Assessment   Upper Extremity Assessment: Defer to OT evaluation           Lower Extremity Assessment: LLE deficits/detail   LLE Deficits /  Details: weakness and limited ROM as expected s/p L THA     Communication   Communication: No difficulties  Cognition Arousal/Alertness: Lethargic Behavior During Therapy: Flat affect Overall Cognitive Status: Within Functional Limits for tasks assessed                      General  Comments General comments (skin integrity, edema, etc.): SpO2 at 91% w/ bed mobility on RA, 95% w/ ambulation on RA    Exercises Total Joint Exercises Ankle Circles/Pumps: AROM;Both;10 reps;Supine Heel Slides: AROM;AAROM;Left;5 reps;Supine Hip ABduction/ADduction: AAROM;Left;5 reps;Supine      Assessment/Plan    PT Assessment Patient needs continued PT services  PT Diagnosis Difficulty walking;Abnormality of gait;Generalized weakness;Acute pain   PT Problem List Decreased strength;Decreased range of motion;Decreased activity tolerance;Decreased balance;Decreased mobility;Decreased coordination;Decreased knowledge of use of DME;Decreased safety awareness;Decreased knowledge of precautions;Pain;Decreased skin integrity  PT Treatment Interventions DME instruction;Gait training;Stair training;Functional mobility training;Therapeutic activities;Therapeutic exercise;Balance training;Neuromuscular re-education;Patient/family education;Modalities   PT Goals (Current goals can be found in the Care Plan section) Acute Rehab PT Goals Patient Stated Goal: to get stronger PT Goal Formulation: With patient/family Time For Goal Achievement: 07/05/14 Potential to Achieve Goals: Good    Frequency 7X/week   Barriers to discharge Inaccessible home environment 3 steps to enter home w/o railing    Co-evaluation               End of Session Equipment Utilized During Treatment: Gait belt Activity Tolerance: Patient tolerated treatment well;Patient limited by fatigue;Patient limited by pain Patient left: in chair;with call bell/phone within reach;with family/visitor present Nurse Communication: Mobility status;Precautions;Weight bearing status         Time: 1610-9604 PT Time Calculation (min) (ACUTE ONLY): 30 min   Charges:   PT Evaluation $Initial PT Evaluation Tier I: 1 Procedure PT Treatments $Gait Training: 8-22 mins   PT G CodesMichail Jewels PT, DPT [phone] Pager:  [phone] 06/28/2014, 12:57 PM

## 2014-06-28 NOTE — Progress Notes (Signed)
Physical Therapy Treatment Patient Details Name: Dominic Walters MRN: [id number] DOB: 30-Jul-1970 Today's Date: 06/28/2014    History of Present Illness Pt is a 44 y/o M s/p L THA.  Pt's PMH includes R THA, HTN, chronic back pain, DVT, stroke in 2014 w/ residual numbness in R hand, and PVD.    PT Comments    Patient is making good progress with PT.  Patient needs to practice stairs next session.  Pt demonstrated ability to ambulate 120 ft again this session w/ increased gait speed.      Follow Up Recommendations  Home health PT;Supervision for mobility/OOB     Equipment Recommendations  None recommended by PT    Recommendations for Other Services OT consult     Precautions / Restrictions Precautions Precautions: Posterior Hip;Fall Precaution Comments: Reviewed post hip precautions; pt did not remember crossing midline precaution Restrictions Weight Bearing Restrictions: Yes LLE Weight Bearing: Weight bearing as tolerated    Mobility  Bed Mobility Overal bed mobility: Needs Assistance Bed Mobility: Supine to Sit     Supine to sit: Min assist;HOB elevated     General bed mobility comments: Min A for managing LLE OOB, heavy use of bed rails  Transfers Overall transfer level: Needs assistance Equipment used: Rolling walker (2 wheeled) Transfers: Sit to/from Stand Sit to Stand: Min guard         General transfer comment: cues for proper hand placement  Ambulation/Gait Ambulation/Gait assistance: Min guard Ambulation Distance (Feet): 120 Feet Assistive device: Rolling walker (2 wheeled) Gait Pattern/deviations: Step-through pattern;Antalgic;Trunk flexed;Decreased stance time - left   Gait velocity interpretation: Below normal speed for age/gender General Gait Details: dec L hip extension w/ compensatory L trunk rotation.  Pt performed heel strike and toe off well which he said he learned from his previous hip replacement   Stairs            Wheelchair  Mobility    Modified Rankin (Stroke Patients Only)       Balance Overall balance assessment: Needs assistance Sitting-balance support: Bilateral upper extremity supported;Feet supported Sitting balance-Leahy Scale: Fair     Standing balance support: Single extremity supported;During functional activity (using urinal) Standing balance-Leahy Scale: Fair                      Cognition Arousal/Alertness: Awake/alert Behavior During Therapy: WFL for tasks assessed/performed Overall Cognitive Status: Within Functional Limits for tasks assessed       Memory: Decreased recall of precautions;Decreased short-term memory              Exercises Total Joint Exercises Ankle Circles/Pumps: AROM;Both;10 reps;Supine Quad Sets: AROM;Both;10 reps;Supine    General Comments        Pertinent Vitals/Pain Pain Assessment: 0-10 Pain Score: 6  Pain Location: L hip and knee Pain Descriptors / Indicators: Stabbing;Grimacing;Aching Pain Intervention(s): Limited activity within patient's tolerance;Monitored during session;Repositioned    Home Living                      Prior Function            PT Goals (current goals can now be found in the care plan section) Acute Rehab PT Goals Patient Stated Goal: to get stronger Progress towards PT goals: Progressing toward goals    Frequency  7X/week    PT Plan Current plan remains appropriate    Co-evaluation             End of  Session Equipment Utilized During Treatment: Gait belt Activity Tolerance: Patient tolerated treatment well Patient left: in chair;with call bell/phone within reach     Time: [id number] PT Time Calculation (min) (ACUTE ONLY): 24 min  Charges:  $Gait Training: 23-37 mins                    G Codes:      Michail JewelsAshley Parr PT, TennesseeDPT [phone] Pager: [phone] 06/28/2014, 5:08 PM

## 2014-06-28 NOTE — Anesthesia Procedure Notes (Signed)
Procedure Name: Intubation Date/Time: 06/28/2014 7:29 AM Performed by: Coralee RudFLORES, Sherilee Smotherman Pre-anesthesia Checklist: Patient identified, Emergency Drugs available, Suction available and Patient being monitored Patient Re-evaluated:Patient Re-evaluated prior to inductionOxygen Delivery Method: Circle system utilized Preoxygenation: Pre-oxygenation with 100% oxygen Intubation Type: IV induction Ventilation: Mask ventilation without difficulty Laryngoscope Size: Miller and 3 Grade View: Grade II Tube type: Oral Tube size: 7.5 mm Number of attempts: 1 Airway Equipment and Method: Stylet Placement Confirmation: ETT inserted through vocal cords under direct vision,  positive ETCO2 and breath sounds checked- equal and bilateral Secured at: 23 cm Tube secured with: Tape Dental Injury: Teeth and Oropharynx as per pre-operative assessment

## 2014-06-28 NOTE — Progress Notes (Signed)
OT Cancellation Note  Patient Details Name: Dominic Walters MRN: [id number] DOB: [birth year]   Cancelled Treatment:    Reason Eval/Treat Not Completed: OT screened, no needs identified, will sign off. Spoke with pt and he has no OT concerns-pt had other hip operated on last year.  Earlie RavelingStraub, Kip Cropp L OTR/L [phone] 06/28/2014, 5:26 PM

## 2014-06-28 NOTE — Anesthesia Postprocedure Evaluation (Signed)
  Anesthesia Post-op Note  Patient: Dominic Walters  Procedure(s) Performed: Procedure(s): TOTAL HIP ARTHROPLASTY (Left)  Patient Location: PACU  Anesthesia Type:General  Level of Consciousness: awake, alert  and oriented  Airway and Oxygen Therapy: Patient Spontanous Breathing and Patient connected to nasal cannula oxygen  Post-op Pain: mild  Post-op Assessment: Post-op Vital signs reviewed, Patient's Cardiovascular Status Stable, Respiratory Function Stable, Patent Airway and No signs of Nausea or vomiting  Post-op Vital Signs: Reviewed and stable  Last Vitals:  Filed Vitals:   06/28/14 0622  BP: 120/86  Pulse: 71  Temp: 36.5 C  Resp: 20    Complications: No apparent anesthesia complications

## 2014-06-28 NOTE — Transfer of Care (Signed)
Immediate Anesthesia Transfer of Care Note  Patient: Dominic Walters  Procedure(s) Performed: Procedure(s): TOTAL HIP ARTHROPLASTY (Left)  Patient Location: PACU  Anesthesia Type:General  Level of Consciousness: awake, alert , oriented and patient cooperative  Airway & Oxygen Therapy: Patient Spontanous Breathing and Patient connected to face mask oxygen  Post-op Assessment: Report given to RN, Post -op Vital signs reviewed and stable and Patient moving all extremities  Post vital signs: Reviewed and stable  Last Vitals:  Filed Vitals:   06/28/14 0622  BP: 120/86  Pulse: 71  Temp: 36.5 C  Resp: 20    Complications: No apparent anesthesia complications

## 2014-06-29 ENCOUNTER — Encounter (HOSPITAL_COMMUNITY): Payer: Self-pay | Admitting: Orthopedic Surgery

## 2014-06-29 LAB — BASIC METABOLIC PANEL
ANION GAP: 10 (ref 5–15)
BUN: 11 mg/dL (ref 6–20)
CHLORIDE: 102 mmol/L (ref 101–111)
CO2: 26 mmol/L (ref 22–32)
Calcium: 8.9 mg/dL (ref 8.9–10.3)
Creatinine, Ser: 1.16 mg/dL (ref 0.61–1.24)
GFR calc Af Amer: >60 mL/min (ref >60)
GFR calc non Af Amer: >60 mL/min (ref >60)
GLUCOSE: 109 mg/dL — AB (ref 70–99)
POTASSIUM: 3.9 mmol/L (ref 3.5–5.1)
Sodium: 138 mmol/L (ref 135–145)

## 2014-06-29 LAB — CBC
HCT: 40.9 % (ref 39.0–52.0)
HEMOGLOBIN: 13.5 g/dL (ref 13.0–17.0)
MCH: 29.7 pg (ref 26.0–34.0)
MCHC: 33 g/dL (ref 30.0–36.0)
MCV: 90.1 fL (ref 78.0–100.0)
Platelets: 233 10*3/uL (ref 150–400)
RBC: 4.54 MIL/uL (ref 4.22–5.81)
RDW: 15.1 % (ref 11.5–15.5)
WBC: 16.5 10*3/uL — ABNORMAL HIGH (ref 4.0–10.5)

## 2014-06-29 NOTE — Progress Notes (Signed)
PATIENT ID: Dominic Walters  MRN: [id number]  DOB/AGE:  Nov 24, 1970 / 44 y.o.  1 Day Post-Op Procedure(s) (LRB): TOTAL HIP ARTHROPLASTY (Left)    PROGRESS NOTE Subjective: Patient is alert, oriented, x1 Nausea, no Vomiting, yes passing gas, no Bowel Movement. Taking PO well. Denies SOB, Chest or Calf Pain. Using Incentive Spirometer, PAS in place. Ambulate 120 ft Patient reports pain as 4 on 0-10 scale  .    Objective: Vital signs in last 24 hours: Filed Vitals:   06/28/14 1042 06/28/14 1235 06/28/14 1940 06/29/14 0610  BP: 131/77 128/72 121/79 126/73  Pulse: 77 82 94 66  Temp: 98.4 F (36.9 C) 98.3 F (36.8 C) 99.6 F (37.6 C) 97.6 F (36.4 C)  TempSrc:  Oral Oral Oral  Resp: 14 16    Height:      Weight:      SpO2: 96% 98% 100% 100%      Intake/Output from previous day: I/O last 3 completed shifts: In: 1790 [P.O.:590; I.V.:1200] Out: 1430 [Urine:1430]   Intake/Output this shift:     LABORATORY DATA:  Recent Labs  06/28/14 0621 06/28/14 1108 06/29/14 0530  WBC  --  15.7* 16.5*  HGB  --  14.0 13.5  HCT  --  43.1 40.9  PLT  --  230 233  CREATININE  --  1.19  --   INR 1.09  --   --     Examination: Neurologically intact ABD soft Neurovascular intact Sensation intact distally Intact pulses distally Dorsiflexion/Plantar flexion intact Incision: dressing C/D/I No cellulitis present Compartment soft} XR AP&Lat of hip shows well placed\fixed THA  Assessment:   1 Day Post-Op Procedure(s) (LRB): TOTAL HIP ARTHROPLASTY (Left) ADDITIONAL DIAGNOSIS:  Expected Acute Blood Loss Anemia, Hx DVT, HTN, Anti-phosphlipid syndrome  Plan: PT/OT WBAT, THA  posterior precautions  DVT Prophylaxis: SCDx72 hrs, ASA 325 mg BID x 2 weeks  DISCHARGE PLAN: Home, today if passes PT  DISCHARGE NEEDS: HHPT, HHRN, CPM, Walker and 3-in-1 comode seat

## 2014-06-29 NOTE — Discharge Summary (Signed)
Patient ID: Dominic Walters MRN: [id number] DOB/AGE: 28-Nov-1970 44 y.o.  Admit date: 06/28/2014 Discharge date: 06/29/2014  Admission Diagnoses:  Principal Problem:   Primary osteoarthritis of left hip Active Problems:   Arthritis, hip   Discharge Diagnoses:  Same  Past Medical History  Diagnosis Date  . Hypertension   . Back pain, chronic     Herniated L 3, 4, 5.  . DVT (deep venous thrombosis)   . Antiphospholipid antibody syndrome   . Blood in urine   . Stroke 11/04/12    residual numbness rt hand-due to blood clot rt arm  . Peripheral vascular disease     rt arm clot  . Blood dyscrasia     Surgeries: Procedure(s): TOTAL HIP ARTHROPLASTY on 06/28/2014   Consultants:    Discharged Condition: Improved  Hospital Course: Dominic Eldersllen R Vine is an 44 y.o. male who was admitted 06/28/2014 for operative treatment ofPrimary osteoarthritis of left hip. Patient has severe unremitting pain that affects sleep, daily activities, and work/hobbies. After pre-op clearance the patient was taken to the operating room on 06/28/2014 and underwent  Procedure(s): TOTAL HIP ARTHROPLASTY.    Patient was given perioperative antibiotics: Anti-infectives    Start     Dose/Rate Route Frequency Ordered Stop   06/28/14 0700  ceFAZolin (ANCEF) 3 g in dextrose 5 % 50 mL IVPB     3 g 160 mL/hr over 30 Minutes Intravenous To [facility]Stay [facility] 06/27/14 1011 06/28/14 0735       Patient was given sequential compression devices, early ambulation, and chemoprophylaxis to prevent DVT.  Patient benefited maximally from hospital stay and there were no complications.    Recent vital signs: Patient Vitals for the past 24 hrs:  BP Temp Temp src Pulse SpO2  06/29/14 0610 126/73 mmHg 97.6 F (36.4 C) Oral 66 100 %  06/28/14 1940 121/79 mmHg 99.6 F (37.6 C) Oral 94 100 %     Recent laboratory studies:  Recent Labs  06/28/14 0621 06/28/14 1108 06/29/14 0530  WBC  --  15.7* 16.5*  HGB  --  14.0 13.5  HCT   --  43.1 40.9  PLT  --  230 233  NA  --   --  138  K  --   --  3.9  CL  --   --  102  CO2  --   --  26  BUN  --   --  11  CREATININE  --  1.19 1.16  GLUCOSE  --   --  109*  INR 1.09  --   --   CALCIUM  --   --  8.9     Discharge Medications:     Medication List    STOP taking these medications        acetaminophen 500 MG tablet  Commonly known as:  TYLENOL      TAKE these medications        aspirin 81 MG tablet  Take 81 mg by mouth daily.     atorvastatin 40 MG tablet  Commonly known as:  LIPITOR  TAKE ONE TABLET BY MOUTH ONE TIME DAILY     b complex vitamins tablet  Take 1 tablet by mouth daily.     cholecalciferol 1000 UNITS tablet  Commonly known as:  VITAMIN D  Take 1,000 Units by mouth daily.     diazepam 10 MG tablet  Commonly known as:  VALIUM  Take 1 tablet (10 mg total) by mouth every 12 (  twelve) hours as needed for anxiety.     enoxaparin 120 MG/0.8ML injection  Commonly known as:  LOVENOX  Inject 0.8 mLs (120 mg total) into the skin every 12 (twelve) hours.     folic acid 400 MCG tablet  Commonly known as:  FOLVITE  Take 400 mcg by mouth daily.     ibuprofen 200 MG tablet  Commonly known as:  ADVIL,MOTRIN  Take 800 mg by mouth daily as needed for mild pain or moderate pain.     lisinopril 20 MG tablet  Commonly known as:  PRINIVIL,ZESTRIL  Take 20 mg by mouth daily.     methocarbamol 500 MG tablet  Commonly known as:  ROBAXIN  Take 1 tablet (500 mg total) by mouth 2 (two) times daily with a meal.     oxyCODONE-acetaminophen 5-325 MG per tablet  Commonly known as:  ROXICET  Take 1-2 tablets by mouth every 6 (six) hours as needed.     triamcinolone lotion 0.1 %  Commonly known as:  KENALOG  Apply 1 application topically daily as needed (Dry skin).     warfarin 1 MG tablet  Commonly known as:  COUMADIN  Take 2-3 mg by mouth daily. Mon Wed Fri 3 mg, all other days 2 mg        Diagnostic Studies: Dg Chest 2 View  06/16/2014    CLINICAL DATA:  Preop, hypertension, stroke 11/04/2012  EXAM: CHEST  2 VIEW  COMPARISON:  07/14/2013  FINDINGS: Cardiomediastinal silhouette is stable. No acute infiltrate or pleural effusion. No pulmonary edema. Bony thorax is unremarkable. Mild degenerative changes thoracic spine.  IMPRESSION: No active cardiopulmonary disease.   Electronically Signed   By: Natasha Mead M.D.   On: 06/16/2014 15:17   Dg Pelvis Portable  06/28/2014   CLINICAL DATA:  Left hip arthroplasty.  EXAM: PORTABLE PELVIS 1-2 VIEWS  COMPARISON:  07/17/2013  FINDINGS: The patient now has a left hip arthroplasty along with the existing right hip arthroplasty. The pelvic bony ring is intact. Both femoral stems are visualized. No evidence for a periprosthetic fracture. Both hips appear located on these AP supine views.  IMPRESSION: Left hip arthroplasty without complicating features.   Electronically Signed   By: Richarda Overlie M.D.   On: 06/28/2014 09:58    Disposition: 06-Home-Health Care Svc      Discharge Instructions    Call MD / Call 911    Complete by:  As directed   If you experience chest pain or shortness of breath, CALL 911 and be transported to the hospital emergency room.  If you develope a fever above 101 F, pus (white drainage) or increased drainage or redness at the wound, or calf pain, call your surgeon's office.     Change dressing    Complete by:  As directed   You may change your dressing on day 5, then change the dressing daily with sterile 4 x 4 inch gauze dressing and paper tape.  You may clean the incision with alcohol prior to redressing     Constipation Prevention    Complete by:  As directed   Drink plenty of fluids.  Prune juice may be helpful.  You may use a stool softener, such as Colace (over the counter) 100 mg twice a day.  Use MiraLax (over the counter) for constipation as needed.     Diet - low sodium heart healthy    Complete by:  As directed      Discharge instructions  Complete by:  As  directed   Follow up in office with Dr. Turner Daniels in 2 weeks.     Driving restrictions    Complete by:  As directed   No driving for 2 weeks     Follow the hip precautions as taught in Physical Therapy    Complete by:  As directed      Increase activity slowly as tolerated    Complete by:  As directed      Patient may shower    Complete by:  As directed   You may shower without a dressing once there is no drainage.  Do not wash over the wound.  If drainage remains, cover wound with plastic wrap and then shower.           Follow-up Information    Follow up with Nestor Lewandowsky, MD In 2 weeks.   Specialty:  Orthopedic Surgery   Contact information:   [address] [place] [address] [phone]        Signed: Vear Clock, Marjorie Deprey R 06/29/2014, 1:30 PM

## 2014-06-29 NOTE — Care Management Note (Signed)
Case Management Note  Patient Details  Name: Irish Eldersllen R Bohnenkamp MRN: [id number] Date of Birth: [birth year]  Subjective/Objective:                    Action/Plan:   Expected Discharge Date:  06/29/14               Expected Discharge Plan:  Home w Home Health Services  In-House Referral:     Discharge planning Services  CM Consult  Post Acute Care Choice:  Home Health Choice offered to:  Patient  DME Arranged:   (Ptient has rolling walker and 3in1) DME Agency:     HH Arranged:  PT HH Agency:  [facility]  Status of Service:  Completed, signed off  Medicare Important Message Given:    Date Medicare IM Given:    Medicare IM give by:    Date Additional Medicare IM Given:    Additional Medicare Important Message give by:     If discussed at Long Length of Stay Meetings, dates discussed:    Additional Comments:  Durenda GuthrieBrady, Lanasia Porras Naomi, RN 06/29/2014, 3:00 PM

## 2014-06-29 NOTE — Progress Notes (Signed)
Physical Therapy Treatment Patient Details Name: Dominic Walters MRN: [id number] DOB: 03-04-1970 Today's Date: 06/29/2014    History of Present Illness Pt is a 44 y/o M s/p L THA.  Pt's PMH includes R THA, HTN, chronic back pain, DVT, stroke in 2014 w/ residual numbness in R hand, and PVD.    PT Comments    Pt is making progress toward goals and increasing functional independence. Able to perform HEP this session and increasing ROM. Pt safe to D/C from a mobility standpoint based on progression toward goals set on PT eval. Plans to D/C later today.  Follow Up Recommendations  Home health PT;Supervision for mobility/OOB     Equipment Recommendations  None recommended by PT    Recommendations for Other Services OT consult     Precautions / Restrictions Precautions Precautions: Posterior Hip;Fall Precaution Comments: Pt able to recall 3/3 hip precautions. Restrictions LLE Weight Bearing: Weight bearing as tolerated    Mobility  Bed Mobility Overal bed mobility: Needs Assistance Bed Mobility: Sit to Supine       Sit to supine: Min assist   General bed mobility comments: Min A to position LLE back onto bed.  Transfers Overall transfer level: Needs assistance Equipment used: Rolling walker (2 wheeled) Transfers: Sit to/from Stand Sit to Stand: Supervision         General transfer comment: Supervision for safety.  Ambulation/Gait Ambulation/Gait assistance: Supervision Ambulation Distance (Feet): 200 Feet Assistive device: Rolling walker (2 wheeled) Gait Pattern/deviations: Step-through pattern;Decreased stance time - left   Gait velocity interpretation: Below normal speed for age/gender General Gait Details: Supervision for safety.   Stairs            Wheelchair Mobility    Modified Rankin (Stroke Patients Only)       Balance                                    Cognition Arousal/Alertness: Awake/alert Behavior During Therapy: WFL  for tasks assessed/performed Overall Cognitive Status: Within Functional Limits for tasks assessed                      Exercises Total Joint Exercises Quad Sets: AROM;Left;10 reps Heel Slides: AAROM;Left;10 reps Hip ABduction/ADduction: AAROM;Left;10 reps Long Arc Quad: AAROM;Left;10 reps    General Comments        Pertinent Vitals/Pain Pain Assessment: 0-10 Pain Score: 4  Pain Location: L hip Pain Descriptors / Indicators: Sore;Grimacing Pain Intervention(s): Monitored during session;Repositioned    Home Living                      Prior Function            PT Goals (current goals can now be found in the care plan section) Progress towards PT goals: Progressing toward goals    Frequency  7X/week    PT Plan Current plan remains appropriate    Co-evaluation             End of Session   Activity Tolerance: Patient tolerated treatment well Patient left: in bed;with call bell/phone within reach;with family/visitor present     Time: 4098-1191 PT Time Calculation (min) (ACUTE ONLY): 21 min  Charges:                       G Codes:      Caroleen Hamman [place], SPTA  06/29/2014, 2:39 PM

## 2014-06-29 NOTE — Progress Notes (Signed)
Physical Therapy Treatment Patient Details Name: LORANZO DESHA MRN: [id number] DOB: 05-24-1970 Today's Date: 06/29/2014    History of Present Illness Pt is a 44 y/o M s/p L THA.  Pt's PMH includes R THA, HTN, chronic back pain, DVT, stroke in 2014 w/ residual numbness in R hand, and PVD.    PT Comments    Pt able to progress ambulation and practice stairs this AM, although he was limited with exercises toward end of session due to increased pain. He is hoping to go home today. Focus on HEP next session as pt is able. Pt safe to D/C from a mobility standpoint based on progression toward goals set on PT eval.   Follow Up Recommendations  Home health PT;Supervision for mobility/OOB     Equipment Recommendations  None recommended by PT    Recommendations for Other Services       Precautions / Restrictions Precautions Precautions: Posterior Hip;Fall Precaution Comments: Pt able to recall 3/3 hip precautions. Restrictions LLE Weight Bearing: Weight bearing as tolerated    Mobility  Bed Mobility Overal bed mobility: Needs Assistance Bed Mobility: Supine to Sit     Supine to sit: Min assist;HOB elevated     General bed mobility comments: Min A for LLE and to position trunk upright. Wife able to provide assistance.  Transfers Overall transfer level: Needs assistance Equipment used: Rolling walker (2 wheeled) Transfers: Sit to/from Stand Sit to Stand: Min guard         General transfer comment: Min guard for safety.  Ambulation/Gait Ambulation/Gait assistance: Supervision Ambulation Distance (Feet): 300 Feet Assistive device: Rolling walker (2 wheeled) Gait Pattern/deviations: Step-through pattern;Decreased stance time - left   Gait velocity interpretation: Below normal speed for age/gender General Gait Details: Cues for foward head. Supervision for safety.   Stairs Stairs: Yes Stairs assistance: Min guard Stair Management: No rails;Backwards;With walker Number  of Stairs: 4 General stair comments: Pt able to go up/down stairs with proper sequencing and safe technique. Familiar with technique from previous R hip replacement.  Wheelchair Mobility    Modified Rankin (Stroke Patients Only)       Balance                                    Cognition Arousal/Alertness: Awake/alert Behavior During Therapy: WFL for tasks assessed/performed Overall Cognitive Status: Within Functional Limits for tasks assessed                      Exercises Total Joint Exercises Ankle Circles/Pumps: AROM;Both;10 reps Quad Sets: AROM;Left;10 reps Heel Slides: AAROM;Left;5 reps Hip ABduction/ADduction: AAROM;Left;5 reps    General Comments        Pertinent Vitals/Pain Pain Assessment: 0-10 Pain Score: 7  Pain Location: L hip and knee during ambulation Pain Descriptors / Indicators: Sore;Grimacing Pain Intervention(s): Monitored during session;Repositioned;Limited activity within patient's tolerance    Home Living                      Prior Function            PT Goals (current goals can now be found in the care plan section) Progress towards PT goals: Progressing toward goals    Frequency  7X/week    PT Plan Current plan remains appropriate    Co-evaluation             End of Session  Activity Tolerance: Patient limited by pain;Other (comment) (Pt tolerated ambulation/stairs well, but limited with exercises due to increased pain.) Patient left: in chair;with call bell/phone within reach;with family/visitor present     Time: [id number] PT Time Calculation (min) (ACUTE ONLY): 35 min  Charges:                       G CodesLeonard Schwartz:      Taeler Winning, SPTA 06/29/2014, 8:44 AM

## 2014-07-06 ENCOUNTER — Other Ambulatory Visit: Payer: Self-pay | Admitting: Family

## 2014-11-04 ENCOUNTER — Other Ambulatory Visit: Payer: Self-pay | Admitting: Family

## 2014-11-10 ENCOUNTER — Other Ambulatory Visit: Payer: Self-pay | Admitting: Family

## 2014-11-20 ENCOUNTER — Other Ambulatory Visit: Payer: Self-pay | Admitting: Family

## 2015-02-17 ENCOUNTER — Other Ambulatory Visit: Payer: Self-pay | Admitting: Family

## 2015-02-24 ENCOUNTER — Other Ambulatory Visit: Payer: Self-pay | Admitting: Family

## 2015-03-26 ENCOUNTER — Other Ambulatory Visit: Payer: Self-pay | Admitting: Family

## 2017-12-31 ENCOUNTER — Other Ambulatory Visit: Payer: Self-pay | Admitting: Family Medicine

## 2017-12-31 DIAGNOSIS — M25512 Pain in left shoulder: Secondary | ICD-10-CM

## 2018-01-04 ENCOUNTER — Ambulatory Visit
Admission: RE | Admit: 2018-01-04 | Discharge: 2018-01-04 | Disposition: A | Discharge status: Home / Self Care | Payer: BLUE CROSS/BLUE SHIELD | Source: Ambulatory Visit | Attending: Family Medicine | Admitting: Family Medicine

## 2018-01-04 DIAGNOSIS — M25512 Pain in left shoulder: Secondary | ICD-10-CM

## 2018-02-10 ENCOUNTER — Other Ambulatory Visit: Payer: Self-pay | Admitting: Orthopedic Surgery

## 2018-03-06 ENCOUNTER — Inpatient Hospital Stay (HOSPITAL_COMMUNITY)
Admission: RE | Admit: 2018-03-06 | Payer: BLUE CROSS/BLUE SHIELD | Source: Home / Self Care | Admitting: Orthopedic Surgery

## 2018-03-06 ENCOUNTER — Encounter (HOSPITAL_COMMUNITY): Admission: RE | Payer: Self-pay | Source: Home / Self Care

## 2018-03-06 SURGERY — ARTHROPLASTY, SHOULDER, TOTAL
Anesthesia: Choice | Laterality: Left

## 2020-04-03 IMAGING — CT CT SHOULDER*L* W/O CM
1 series · 12 of 14 positions shown, 15 images · non-contrast
Comparison: None.

CLINICAL DATA: Chronic left shoulder pain.

EXAM:
CT OF THE UPPER LEFT EXTREMITY WITHOUT CONTRAST
TECHNIQUE: Multidetector CT imaging of the upper left extremity was performed
according to the standard protocol.

[Series 2: soft tissue · axial · 0.56mm/px · z∈[-265,-83]mm · 12 of 109 slices shown, 15 images]
[im 9/109  soft-tissue]
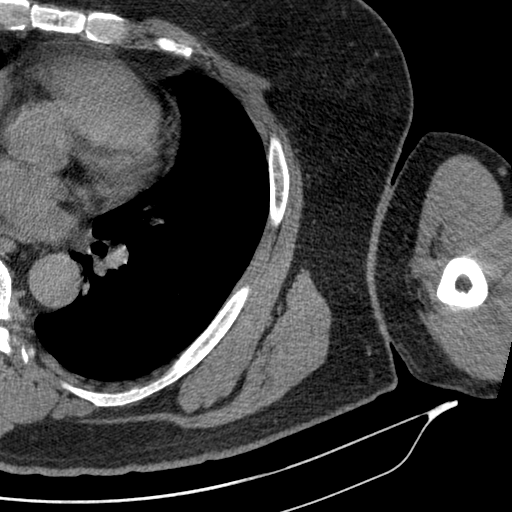
[im 9/109  bone]
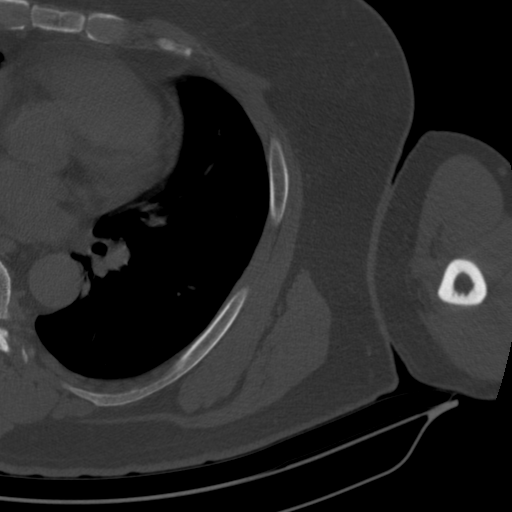
[im 17/109  bone]
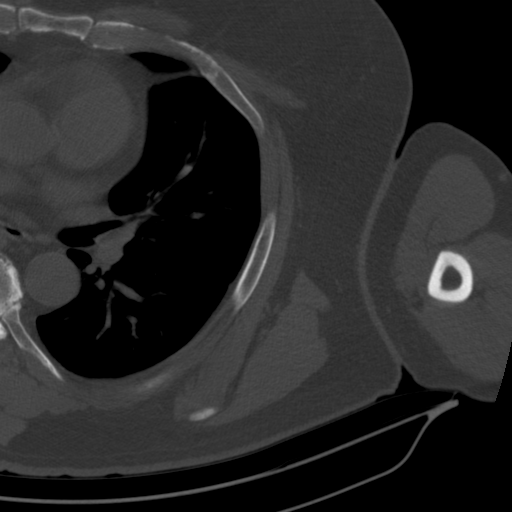
[im 25/109  bone]
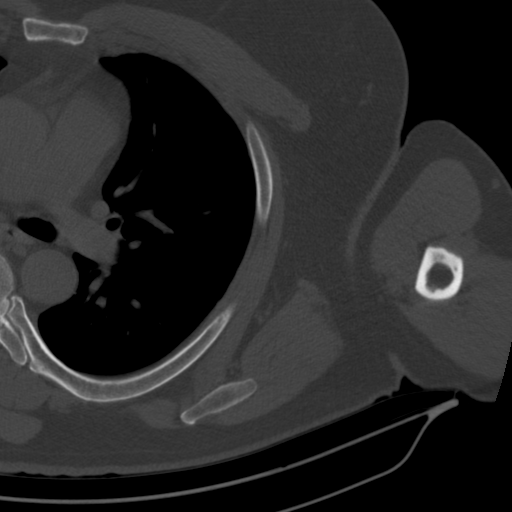
[im 34/109  bone]
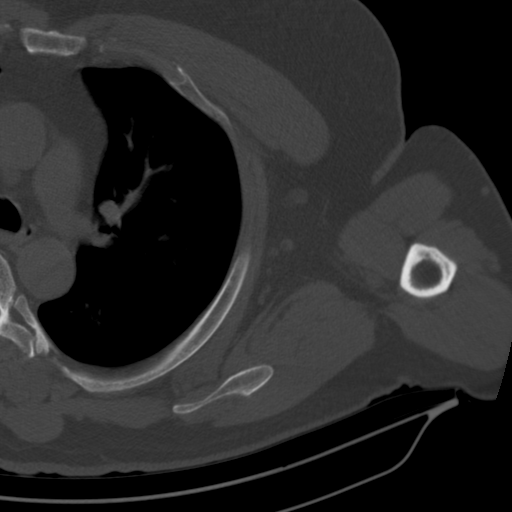
[im 42/109  soft-tissue]
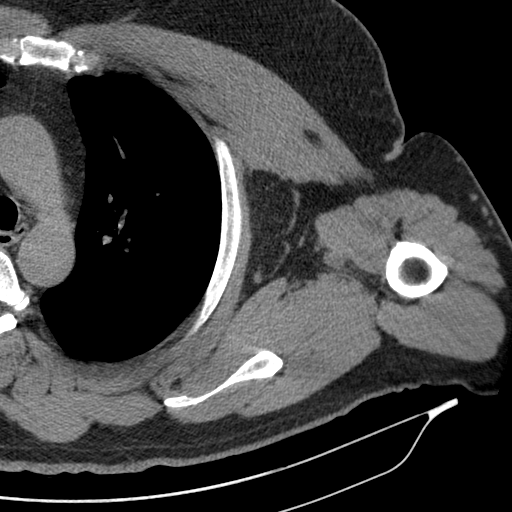
[im 42/109  bone]
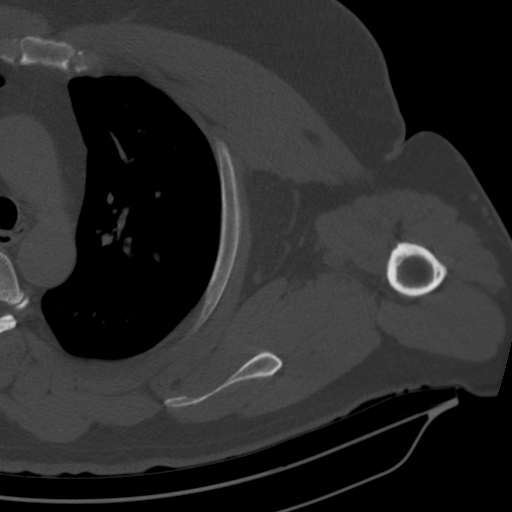
[im 50/109  bone]
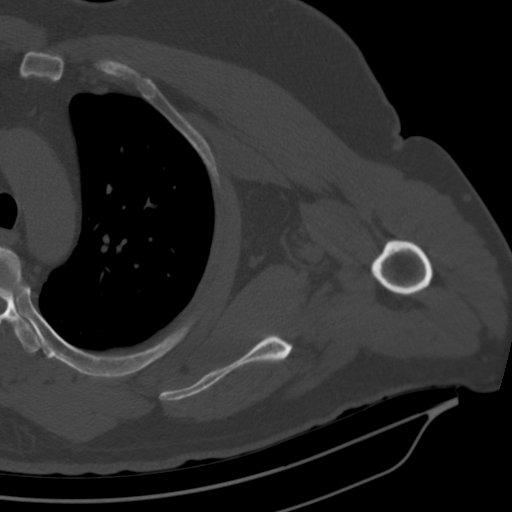
[im 59/109  bone]
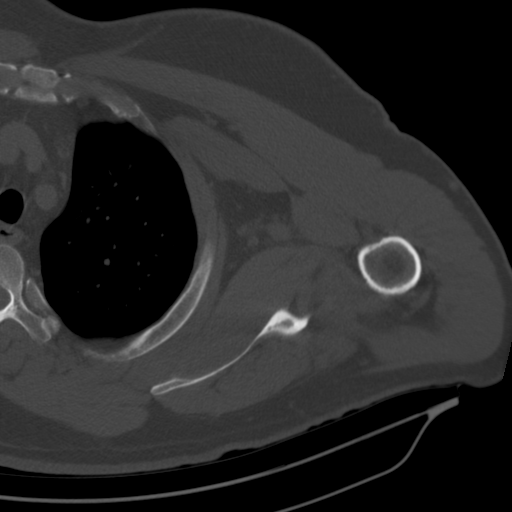
[im 67/109  bone]
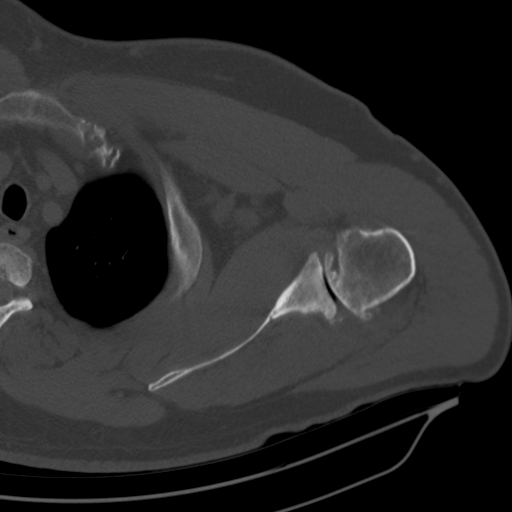
[im 75/109  soft-tissue]
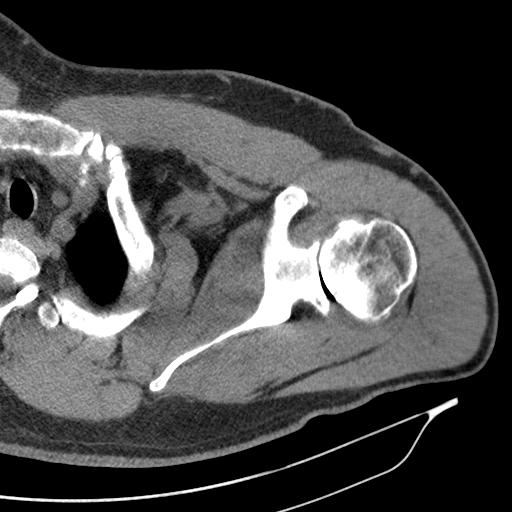
[im 75/109  bone]
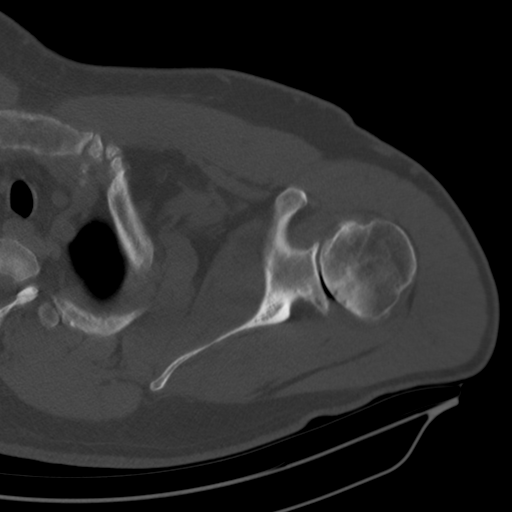
[im 84/109  bone]
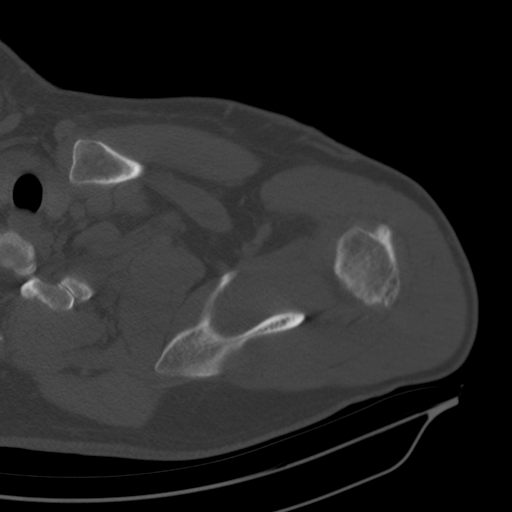
[im 92/109  bone]
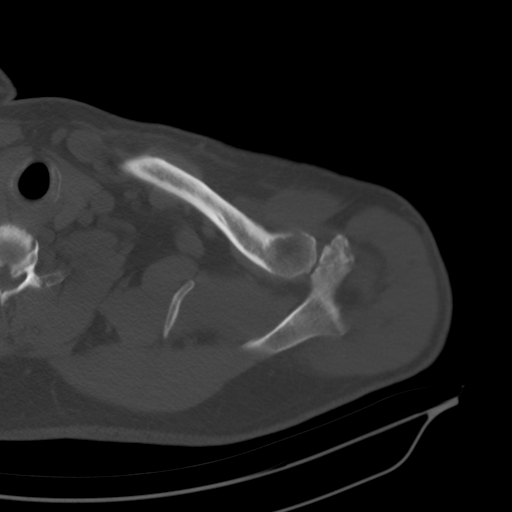
[im 100/109  bone]
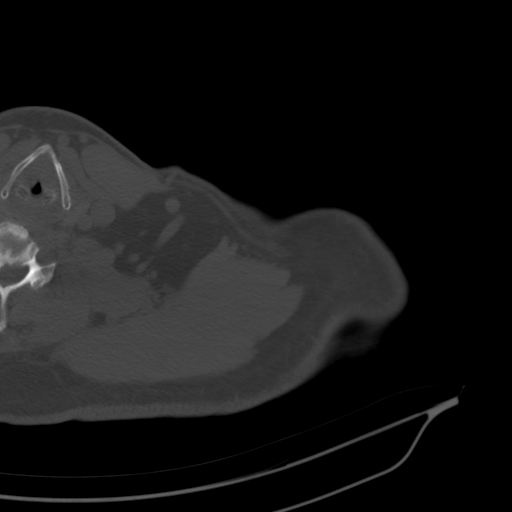

[12 of 14 positions shown; findings below may reference images not displayed]

FINDINGS: Advanced glenohumeral joint degenerative changes with marked joint
space narrowing, areas of full-thickness cartilage loss, osteophytic
spurring, bony eburnation and subchondral cystic change. I do not
see any significant widening or thinning of the glenoid. Posterior
calcification could be an old reverse Bankart fracture or dystrophic
capsular calcifications.

Mild AC joint degenerative changes. Os acromial noted. Type 2
acromion. No lateral downsloping or undersurface spurring.

The left ribs are intact and the left lung is grossly clear. No
worrisome pulmonary lesions.
IMPRESSION: 1. Advanced glenohumeral joint degenerative changes as discussed
above.
2. No acute bony findings or worrisome bone lesions.

## 2023-05-08 ENCOUNTER — Other Ambulatory Visit: Payer: Self-pay | Admitting: Orthopedic Surgery

## 2023-05-08 NOTE — Progress Notes (Signed)
 Surgery orders requested via Epic inbox.

## 2023-05-09 NOTE — Patient Instructions (Signed)
 SURGICAL WAITING ROOM VISITATION  Patients having surgery or a procedure may have no more than 2 support people in the waiting area - these visitors may rotate.    Children under the age of 35 must have an adult with them who is not the patient.  Due to an increase in RSV and influenza rates and associated hospitalizations, children ages 44 and under may not visit patients in [facility] hospitals.  Visitors with respiratory illnesses are discouraged from visiting and should remain at home.  If the patient needs to stay at the hospital during part of their recovery, the visitor guidelines for inpatient rooms apply. Pre-op nurse will coordinate an appropriate time for 1 support person to accompany patient in pre-op.  This support person may not rotate.    Please refer to the [facility] website for the visitor guidelines for Inpatients (after your surgery is over and you are in a regular room).       Your procedure is scheduled on: 05/20/23   Report to [facility] Main Entrance    Report to admitting at 10 AM   Call this number if you have problems the morning of surgery [phone]   Do not eat food :After Midnight.   After Midnight you may have the following liquids until ______ AM/ PM DAY OF SURGERY  Water Non-Citrus Juices (without pulp, NO RED-Apple, White grape, White cranberry) Black Coffee (NO MILK/CREAM OR CREAMERS, sugar ok)  Clear Tea (NO MILK/CREAM OR CREAMERS, sugar ok) regular and decaf                             Plain Jell-O (NO RED)                                           Fruit ices (not with fruit pulp, NO RED)                                     Popsicles (NO RED)                                                               Sports drinks like Gatorade (NO RED)              Drink 2 Ensure/G2 drinks AT 10:00 PM the night before surgery.        The day of surgery:  Drink ONE (1) Pre-Surgery Clear Ensure or G2 at AM the morning of surgery. Drink in  one sitting. Do not sip.  This drink was given to you during your hospital  pre-op appointment visit. Nothing else to drink after completing the  Pre-Surgery Clear Ensure or G2.          If you have questions, please contact your surgeon's office.    Oral Hygiene is also important to reduce your risk of infection.  Remember - BRUSH YOUR TEETH THE MORNING OF SURGERY WITH YOUR REGULAR TOOTHPASTE  DENTURES WILL BE REMOVED PRIOR TO SURGERY PLEASE DO NOT APPLY "Poly grip" OR ADHESIVES!!!   Do NOT smoke after Midnight   Stop all vitamins and herbal supplements 7 days before surgery.   Take these medicines the morning of surgery with A SIP OF WATER: Famotidine(Pepcid)  DO NOT TAKE ANY ORAL DIABETIC MEDICATIONS DAY OF YOUR SURGERY Hold Ozempic for 7 days prior to surgery. Last dose _____             You may not have any metal on your body including hair pins, jewelry, and body piercing             Do not wear make-up, lotions, powders, perfumes/cologne, or deodorant              Men may shave face and neck.   Do not bring valuables to the hospital. [facility] IS NOT             RESPONSIBLE   FOR VALUABLES.   Contacts, glasses, dentures or bridgework may not be worn into surgery.  DO NOT BRING YOUR HOME MEDICATIONS TO THE HOSPITAL. PHARMACY WILL DISPENSE MEDICATIONS LISTED ON YOUR MEDICATION LIST TO YOU DURING YOUR ADMISSION IN THE HOSPITAL!    Patients discharged on the day of surgery will not be allowed to drive home.  Someone NEEDS to stay with you for the first 24 hours after anesthesia.   Special Instructions: Bring a copy of your healthcare power of attorney and living will documents the day of surgery if you haven't scanned them before.              Please read over the following fact sheets you were given: IF YOU HAVE QUESTIONS ABOUT YOUR PRE-OP INSTRUCTIONS PLEASE CALL [phone] [facility]   If you received a COVID test during your pre-op  visit  it is requested that you wear a mask when out in public, stay away from anyone that may not be feeling well and notify your surgeon if you develop symptoms. If you test positive for Covid or have been in contact with anyone that has tested positive in the last 10 days please notify you surgeon.      Pre-operative 5 CHG Bath Instructions   You can play a key role in reducing the risk of infection after surgery. Your skin needs to be as free of germs as possible. You can reduce the number of germs on your skin by washing with CHG (chlorhexidine gluconate) soap before surgery. CHG is an antiseptic soap that kills germs and continues to kill germs even after washing.   DO NOT use if you have an allergy to chlorhexidine/CHG or antibacterial soaps. If your skin becomes reddened or irritated, stop using the CHG and notify one of our RNs at [phone].   Please shower with the CHG soap starting 4 days before surgery using the following schedule:     Please keep in mind the following:  DO NOT shave, including legs and underarms, starting the day of your first shower.   You may shave your face at any point before/day of surgery.  Place clean sheets on your bed the day you start using CHG soap. Use a clean washcloth (not used since being washed) for each shower. DO NOT sleep with pets once you start using the CHG.   CHG Shower Instructions:  If you choose to wash your hair and private  area, wash first with your normal shampoo/soap.  After you use shampoo/soap, rinse your hair and body thoroughly to remove shampoo/soap residue.  Turn the water OFF and apply about 3 tablespoons (45 ml) of CHG soap to a CLEAN washcloth.  Apply CHG soap ONLY FROM YOUR NECK DOWN TO YOUR TOES (washing for 3-5 minutes)  DO NOT use CHG soap on face, private areas, open wounds, or sores.  Pay special attention to the area where your surgery is being performed.  If you are having back surgery, having someone wash  your back for you may be helpful. Wait 2 minutes after CHG soap is applied, then you may rinse off the CHG soap.  Pat dry with a clean towel  Put on clean clothes/pajamas   If you choose to wear lotion, please use ONLY the CHG-compatible lotions on the back of this paper.     Additional instructions for the day of surgery: DO NOT APPLY any lotions, deodorants, cologne, or perfumes.   Put on clean/comfortable clothes.  Brush your teeth.  Ask your nurse before applying any prescription medications to the skin.      CHG Compatible Lotions   Aveeno Moisturizing lotion  Cetaphil Moisturizing Cream  Cetaphil Moisturizing Lotion  Clairol Herbal Essence Moisturizing Lotion, Dry Skin  Clairol Herbal Essence Moisturizing Lotion, Extra Dry Skin  Clairol Herbal Essence Moisturizing Lotion, Normal Skin  Curel Age Defying Therapeutic Moisturizing Lotion with Alpha Hydroxy  Curel Extreme Care Body Lotion  Curel Soothing Hands Moisturizing Hand Lotion  Curel Therapeutic Moisturizing Cream, Fragrance-Free  Curel Therapeutic Moisturizing Lotion, Fragrance-Free  Curel Therapeutic Moisturizing Lotion, Original Formula  Eucerin Daily Replenishing Lotion  Eucerin Dry Skin Therapy Plus Alpha Hydroxy Crme  Eucerin Dry Skin Therapy Plus Alpha Hydroxy Lotion  Eucerin Original Crme  Eucerin Original Lotion  Eucerin Plus Crme Eucerin Plus Lotion  Eucerin TriLipid Replenishing Lotion  Keri Anti-Bacterial Hand Lotion  Keri Deep Conditioning Original Lotion Dry Skin Formula Softly Scented  Keri Deep Conditioning Original Lotion, Fragrance Free Sensitive Skin Formula  Keri Lotion Fast Absorbing Fragrance Free Sensitive Skin Formula  Keri Lotion Fast Absorbing Softly Scented Dry Skin Formula  Keri Original Lotion  Keri Skin Renewal Lotion Keri Silky Smooth Lotion  Keri Silky Smooth Sensitive Skin Lotion  Nivea Body Creamy Conditioning Oil  Nivea Body Extra Enriched TEFL teacher Moisturizing Lotion Nivea Crme  Nivea Skin Firming Lotion  NutraDerm 30 Skin Lotion  NutraDerm Skin Lotion  NutraDerm Therapeutic Skin Cream  NutraDerm Therapeutic Skin Lotion  ProShield Protective Hand Cream  WHAT IS A BLOOD TRANSFUSION? Blood Transfusion Information  A transfusion is the replacement of blood or some of its parts. Blood is made up of multiple cells which provide different functions. Red blood cells carry oxygen and are used for blood loss replacement. White blood cells fight against infection. Platelets control bleeding. Plasma helps clot blood. Other blood products are available for specialized needs, such as hemophilia or other clotting disorders. BEFORE THE TRANSFUSION  Who gives blood for transfusions?  Healthy volunteers who are fully evaluated to make sure their blood is safe. This is blood bank blood. Transfusion therapy is the safest it has ever been in the practice of medicine. Before blood is taken from a donor, a complete history is taken to make sure that person has no history of diseases nor engages in risky social behavior (examples are intravenous drug use or sexual activity with multiple partners). The  donor's travel history is screened to minimize risk of transmitting infections, such as malaria. The donated blood is tested for signs of infectious diseases, such as HIV and hepatitis. The blood is then tested to be sure it is compatible with you in order to minimize the chance of a transfusion reaction. If you or a relative donates blood, this is often done in anticipation of surgery and is not appropriate for emergency situations. It takes many days to process the donated blood. RISKS AND COMPLICATIONS Although transfusion therapy is very safe and saves many lives, the main dangers of transfusion include:  Getting an infectious disease. Developing a transfusion reaction. This is an allergic reaction to something in the blood you  were given. Every precaution is taken to prevent this. The decision to have a blood transfusion has been considered carefully by your caregiver before blood is given. Blood is not given unless the benefits outweigh the risks. AFTER THE TRANSFUSION Right after receiving a blood transfusion, you will usually feel much better and more energetic. This is especially true if your red blood cells have gotten low (anemic). The transfusion raises the level of the red blood cells which carry oxygen, and this usually causes an energy increase. The nurse administering the transfusion will monitor you carefully for complications. HOME CARE INSTRUCTIONS  No special instructions are needed after a transfusion. You may find your energy is better. Speak with your caregiver about any limitations on activity for underlying diseases you may have. SEEK MEDICAL CARE IF:  Your condition is not improving after your transfusion. You develop redness or irritation at the intravenous (IV) site. SEEK IMMEDIATE MEDICAL CARE IF:  Any of the following symptoms occur over the next 12 hours: Shaking chills. You have a temperature by mouth above 102 F (38.9 C), not controlled by medicine. Chest, back, or muscle pain. People around you feel you are not acting correctly or are confused. Shortness of breath or difficulty breathing. Dizziness and fainting. You get a rash or develop hives. You have a decrease in urine output. Your urine turns a dark color or changes to pink, red, or brown. Any of the following symptoms occur over the next 10 days: You have a temperature by mouth above 102 F (38.9 C), not controlled by medicine. Shortness of breath. Weakness after normal activity. The white part of the eye turns yellow (jaundice). You have a decrease in the amount of urine or are urinating less often. Your urine turns a dark color or changes to pink, red, or brown. Document Released: 02/10/2000 Document Revised: 05/07/2011  Document Reviewed: 09/29/2007 [facility] Patient Information 2014 [place], [place].

## 2023-05-09 NOTE — Progress Notes (Signed)
 Please send pre op orders for PST visit 05/10/23.

## 2023-05-09 NOTE — Progress Notes (Signed)
 COVID Vaccine received:  []  No [x]  Yes Date of any COVID positive Test in last 90 days: no PCP - Dr. Reubin Milan @ [facility] Cardiologist -   Chest x-ray -  EKG -  05/10/23 Epic Stress Test -  ECHO -  Cardiac Cath -   Medical clearance 04/19/23 Chianne Kline PA-C  Bowel Prep - [x]  No  []   Yes ______  Pacemaker / ICD device [x]  No []  Yes   Spinal Cord Stimulator:[x]  No []  Yes       History of Sleep Apnea? [x]  No []  Yes   CPAP used?- [x]  No []  Yes    Does the patient monitor blood sugar?          [x]  No []  Yes  []  N/A  Patient has: []  NO Hx DM   []  Pre-DM                 []  DM1  [x]   DM2 Does patient have a [employer] or Dexacom? []  No []  Yes   Fasting Blood Sugar Ranges-  Checks Blood Sugar ___0__ times a day  GLP1 agonist / usual dose - Ozempic  hold x7 days. Last dose 05/11/23 GLP1 instructions:  SGLT-2 inhibitors / usual dose -  SGLT-2 instructions:   Blood Thinner / Instructions:Lovenox bridge. Pt. Will call MD to find out when to stop Aspirin Instructions:N/A  Comments:   Activity level: Patient is able to climb a flight of stairs without difficulty; [x]  No CP  [x]  No SOB,   Patient can  perform ADLs without assistance.   Anesthesia review: Antiphospholipid antibody syndrome, DVT, HTN, Abnl. EKG  Patient denies shortness of breath, fever, cough and chest pain at PAT appointment.  Patient verbalized understanding and agreement to the Pre-Surgical Instructions that were given to them at this PAT appointment. Patient was also educated of the need to review these PAT instructions again prior to his/her surgery.I reviewed the appropriate phone numbers to call if they have any and questions or concerns.

## 2023-05-10 ENCOUNTER — Encounter (HOSPITAL_COMMUNITY)
Admission: RE | Admit: 2023-05-10 | Discharge: 2023-05-10 | Disposition: A | Discharge status: Home / Self Care | Source: Ambulatory Visit | Attending: Orthopedic Surgery | Admitting: Orthopedic Surgery

## 2023-05-10 ENCOUNTER — Encounter (HOSPITAL_COMMUNITY): Payer: Self-pay

## 2023-05-10 ENCOUNTER — Other Ambulatory Visit: Payer: Self-pay

## 2023-05-10 VITALS — BP 129/92 | HR 82 | Temp 97.9°F | Resp 16 | Ht 72.0 in | Wt 275.0 lb

## 2023-05-10 DIAGNOSIS — Z01818 Encounter for other preprocedural examination: Secondary | ICD-10-CM | POA: Diagnosis present

## 2023-05-10 DIAGNOSIS — I10 Essential (primary) hypertension: Secondary | ICD-10-CM | POA: Insufficient documentation

## 2023-05-10 DIAGNOSIS — I440 Atrioventricular block, first degree: Secondary | ICD-10-CM | POA: Insufficient documentation

## 2023-05-10 LAB — BASIC METABOLIC PANEL
Anion gap: 12 (ref 5–15)
BUN: 22 mg/dL — ABNORMAL HIGH (ref 6–20)
CO2: 20 mmol/L — ABNORMAL LOW (ref 22–32)
Calcium: 8.6 mg/dL — ABNORMAL LOW (ref 8.9–10.3)
Chloride: 103 mmol/L (ref 98–111)
Creatinine, Ser: 1.18 mg/dL (ref 0.61–1.24)
GFR, Estimated: >60 mL/min (ref >60)
Glucose, Bld: 88 mg/dL (ref 70–99)
Potassium: 4.2 mmol/L (ref 3.5–5.1)
Sodium: 135 mmol/L (ref 135–145)

## 2023-05-10 LAB — CBC
HCT: 48.5 % (ref 39.0–52.0)
Hemoglobin: 15.7 g/dL (ref 13.0–17.0)
MCH: 30.1 pg (ref 26.0–34.0)
MCHC: 32.4 g/dL (ref 30.0–36.0)
MCV: 92.9 fL (ref 80.0–100.0)
Platelets: 217 10*3/uL (ref 150–400)
RBC: 5.22 MIL/uL (ref 4.22–5.81)
RDW: 14.1 % (ref 11.5–15.5)
WBC: 9.3 10*3/uL (ref 4.0–10.5)
nRBC: 0 % (ref 0.0–0.2)

## 2023-05-10 LAB — GLUCOSE, CAPILLARY: Glucose-Capillary: 130 mg/dL — ABNORMAL HIGH (ref 70–99)

## 2023-05-13 ENCOUNTER — Encounter (HOSPITAL_COMMUNITY)
Admission: RE | Admit: 2023-05-13 | Discharge: 2023-05-13 | Disposition: A | Discharge status: Home / Self Care | Source: Ambulatory Visit | Attending: Orthopedic Surgery | Admitting: Orthopedic Surgery

## 2023-05-15 DIAGNOSIS — T84018A Broken internal joint prosthesis, other site, initial encounter: Secondary | ICD-10-CM

## 2023-05-15 NOTE — H&P (Signed)
 TOTAL HIP REVISION ADMISSION H&P  Patient is admitted for left revision total hip arthroplasty.  Subjective:  Chief Complaint: left hip pain  HPI: Dominic Walters, 53 y.o. male, has a history of pain and functional disability in the left hip due to  failure of polyethylene liner and patient has failed non-surgical conservative treatments for greater than 12 weeks to include NSAID's and/or analgesics, use of assistive devices, and activity modification. The indications for the revision total hip arthroplasty are bearing surface wear leading to  implant or hip misalignment.  Onset of symptoms was abrupt starting 1 month ago with rapidlly worsening course since that time.  Prior procedures on the left hip include arthroplasty.  Patient currently rates pain in the left hip at 10 out of 10 with activity.  There is worsening of pain with activity and weight bearing, trendelenberg gait, pain that interfers with activities of daily living, pain with passive range of motion, and crepitus. Patient has evidence of joint space narrowing by imaging studies.  This condition presents safety issues increasing the risk of falls.   There is no current active infection.  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   Blood dyscrasia    Blood in urine    DVT (deep venous thrombosis) (HCC)    Hypertension    Peripheral vascular disease (HCC)    rt arm clot   Stroke (HCC) 11/04/2012   residual numbness rt hand-due to blood clot rt arm    Past Surgical History:  Procedure Laterality Date   CARPAL TUNNEL RELEASE Bilateral    GALLBLADDER SURGERY     HERNIA REPAIR     THROMBECTOMY     right arm   TOE SURGERY Left    great toe fx pinned   TOTAL HIP ARTHROPLASTY Right 07/17/2013   Procedure: TOTAL HIP ARTHROPLASTY;  Surgeon: Nestor Lewandowsky, MD;  Location: [facility] OR;  Service: Orthopedics;  Laterality: Right;   TOTAL HIP ARTHROPLASTY Left 06/28/2014   Procedure: TOTAL HIP ARTHROPLASTY;  Surgeon: Gean Birchwood, MD;  Location: [facility] OR;  Service: Orthopedics;  Laterality: Left;    No current facility-administered medications for this encounter.   Current Outpatient Medications  Medication Sig Dispense Refill Last Dose/Taking   atorvastatin (LIPITOR) 40 MG tablet TAKE ONE TABLET BY MOUTH ONE TIME DAILY (Patient taking differently: Take 40 mg by mouth every evening.) 30 tablet 0 Taking Differently   enoxaparin (LOVENOX) 120 MG/0.8ML injection Inject 0.8 mLs (120 mg total) into the skin every 12 (twelve) hours. 10 Syringe 0 Taking   famotidine (PEPCID) 20 MG tablet Take  20 mg by mouth daily as needed (stomach acid/reflux).   Taking As Needed   lisinopril (ZESTRIL) 40 MG tablet Take 40 mg by mouth every evening.   Taking   Multiple Vitamin (M.V.I.-12, WITHOUT VITAMIN K, IV) Take 1 tablet by mouth every evening. Multivitamin - No Vitamin K   Taking   OZEMPIC, 1 MG/DOSE, 4 MG/3ML SOPN Inject 1 mg into the skin every Saturday at 6 PM.   Taking   warfarin (COUMADIN) 1 MG tablet Take 2-3 mg by mouth See admin  instructions. Take 2 tablets (2 mg) by mouth on Sundays, Tuesdays, Wednesdays, Thursdays & Saturdays in the evening.  Take 3 tablets (3 mg) by mouth on Mondays & Fridays in the evening.      Allergies  Allergen Reactions   Januvia [Sitagliptin] Rash    "Breaks out"    Metformin Rash    "Breaks out"   Vicodin [Hydrocodone-Acetaminophen] Itching   Ivp Dye [Iodinated Contrast Media] Rash    Social History   Tobacco Use   Smoking status: Every Day    Current packs/day: 0.50    Average packs/day: 0.5 packs/day for 25.0 years (12.5 ttl pk-yrs)    Types: Cigarettes   Smokeless tobacco: Not on file  Substance Use Topics   Alcohol use: No    Family History  Problem Relation Age of Onset   Arthritis Mother    Hypertension Mother    Diabetes Mother    Hyperlipidemia Mother    Cancer Father        lung   Heart disease Father    Diabetes Father    Hyperlipidemia Father    Hypertension Father    Heart attack Father    Diabetes Maternal Grandmother    Hypertension Maternal Grandmother    Heart disease Maternal Grandmother    Hyperlipidemia Sister    Hypertension Sister       Review of Systems  Constitutional: Negative.   HENT: Negative.    Eyes: Negative.   Respiratory: Negative.    Cardiovascular:        HTN  Gastrointestinal: Negative.   Endocrine: Negative.   Genitourinary: Negative.   Musculoskeletal:  Positive for arthralgias.  Skin: Negative.   Allergic/Immunologic: Negative.   Neurological: Negative.   Hematological:        Hx of blood clots  Psychiatric/Behavioral: Negative.      Objective:  Physical Exam Constitutional:      Appearance: Normal appearance. He is normal weight.  HENT:     Head: Normocephalic and atraumatic.     Nose: Nose normal.  Eyes:     Pupils: Pupils are equal, round, and reactive to light.  Cardiovascular:     Pulses: Normal pulses.  Pulmonary:     Effort: Pulmonary effort is normal.  Musculoskeletal:     Cervical back:  Normal range of motion and neck supple.     Comments: the patient's right hip has no pain with hip flexion extension internal and external rotation and log roll.  Patient's left hip does have mild irritability with active hip flexion abduction and adduction.  Mild discomfort with internal and external rotation and this is limited to approximately 10-15.  His calves are soft and non-tender.  Skin:    General: Skin is warm and dry.  Neurological:     General: No focal deficit present.     Mental Status: He is alert and oriented to person, place, and time. Mental status is at baseline.  Psychiatric:  Mood and Affect: Mood normal.        Behavior: Behavior normal.        Thought Content: Thought content normal.        Judgment: Judgment normal.     Vital signs in last 24 hours:     Labs:   Estimated body mass index is 37.3 kg/m as calculated from the following:   Height as of 05/10/23: 6' (1.829 m).   Weight as of 05/10/23: 124.7 kg.  Imaging Review:  Plain radiographs demonstrate  AP of the pelvis and crosstable lateral of the left hip are taken and reviewed in office today.  This shows a well-placed well fixed right total hip arthroplasty with S-ROM stem.  Patient's left total hip arthroplasty femoral side also appears be well-placed and well fixed.  However, the acetabular side does appear to show significant wear of the polyethylene liner.  Assessment/Plan:  End stage arthritis, left hip(s) with failed previous arthroplasty.  The patient history, physical examination, clinical judgement of the provider and imaging studies are consistent with end stage degenerative joint disease of the left hip(s), previous total hip arthroplasty. Revision total hip arthroplasty is deemed medically necessary. The treatment options including medical management, injection therapy, arthroscopy and arthroplasty were discussed at length. The risks and benefits of total hip arthroplasty were  presented and reviewed. The risks due to aseptic loosening, infection, stiffness, dislocation/subluxation,  thromboembolic complications and other imponderables were discussed.  The patient acknowledged the explanation, agreed to proceed with the plan and consent was signed. Patient is being admitted for inpatient treatment for surgery, pain control, PT, OT, prophylactic antibiotics, VTE prophylaxis, progressive ambulation and ADL's and discharge planning. The patient is planning to be discharged home with home health services

## 2023-05-19 MED ORDER — TRANEXAMIC ACID 1000 MG/10ML IV SOLN
2000.0000 mg | INTRAVENOUS | Status: DC
  Filled 2023-05-19: qty 20

## 2023-05-20 ENCOUNTER — Encounter (HOSPITAL_COMMUNITY)
Admission: RE | Disposition: A | Discharge status: Home / Self Care | Payer: Self-pay | Source: Ambulatory Visit | Attending: Orthopedic Surgery

## 2023-05-20 ENCOUNTER — Other Ambulatory Visit: Payer: Self-pay

## 2023-05-20 ENCOUNTER — Ambulatory Visit (HOSPITAL_COMMUNITY): Admitting: Anesthesiology

## 2023-05-20 ENCOUNTER — Ambulatory Visit (HOSPITAL_COMMUNITY)

## 2023-05-20 ENCOUNTER — Encounter (HOSPITAL_COMMUNITY): Payer: Self-pay | Admitting: Orthopedic Surgery

## 2023-05-20 ENCOUNTER — Ambulatory Visit (HOSPITAL_COMMUNITY)
Admission: RE | Admit: 2023-05-20 | Discharge: 2023-05-20 | Disposition: A | Discharge status: Home / Self Care | Source: Ambulatory Visit | Attending: Orthopedic Surgery | Admitting: Orthopedic Surgery

## 2023-05-20 DIAGNOSIS — Y792 Prosthetic and other implants, materials and accessory orthopedic devices associated with adverse incidents: Secondary | ICD-10-CM | POA: Diagnosis not present

## 2023-05-20 DIAGNOSIS — D6861 Antiphospholipid syndrome: Secondary | ICD-10-CM | POA: Diagnosis not present

## 2023-05-20 DIAGNOSIS — I739 Peripheral vascular disease, unspecified: Secondary | ICD-10-CM | POA: Insufficient documentation

## 2023-05-20 DIAGNOSIS — F17210 Nicotine dependence, cigarettes, uncomplicated: Secondary | ICD-10-CM | POA: Diagnosis not present

## 2023-05-20 DIAGNOSIS — T84091A Other mechanical complication of internal left hip prosthesis, initial encounter: Secondary | ICD-10-CM | POA: Insufficient documentation

## 2023-05-20 DIAGNOSIS — Z8673 Personal history of transient ischemic attack (TIA), and cerebral infarction without residual deficits: Secondary | ICD-10-CM | POA: Insufficient documentation

## 2023-05-20 DIAGNOSIS — T84018A Broken internal joint prosthesis, other site, initial encounter: Secondary | ICD-10-CM

## 2023-05-20 DIAGNOSIS — Z01818 Encounter for other preprocedural examination: Secondary | ICD-10-CM

## 2023-05-20 DIAGNOSIS — I10 Essential (primary) hypertension: Secondary | ICD-10-CM | POA: Diagnosis not present

## 2023-05-20 DIAGNOSIS — M1612 Unilateral primary osteoarthritis, left hip: Secondary | ICD-10-CM | POA: Diagnosis not present

## 2023-05-20 DIAGNOSIS — T84011A Broken internal left hip prosthesis, initial encounter: Secondary | ICD-10-CM

## 2023-05-20 HISTORY — PX: TOTAL HIP REVISION: SHX763

## 2023-05-20 LAB — TYPE AND SCREEN
ABO/RH(D): A POS
Antibody Screen: NEGATIVE

## 2023-05-20 LAB — GLUCOSE, CAPILLARY: Glucose-Capillary: 90 mg/dL (ref 70–99)

## 2023-05-20 LAB — PROTIME-INR
INR: 1 (ref 0.8–1.2)
Prothrombin Time: 13.1 s (ref 11.4–15.2)

## 2023-05-20 LAB — SURGICAL PCR SCREEN
MRSA, PCR: NEGATIVE
Staphylococcus aureus: NEGATIVE

## 2023-05-20 SURGERY — TOTAL HIP REVISION
Anesthesia: General | Site: Hip | Laterality: Left

## 2023-05-20 MED ORDER — PHENYLEPHRINE HCL (PRESSORS) 10 MG/ML IV SOLN
INTRAVENOUS | Status: AC
  Filled 2023-05-20: qty 1

## 2023-05-20 MED ORDER — FENTANYL CITRATE PF 50 MCG/ML IJ SOSY: 25.0000 ug | PREFILLED_SYRINGE | INTRAMUSCULAR | Status: DC | PRN

## 2023-05-20 MED ORDER — MIDAZOLAM HCL 2 MG/2ML IJ SOLN
INTRAMUSCULAR | Status: AC
  Filled 2023-05-20: qty 2

## 2023-05-20 MED ORDER — ROCURONIUM BROMIDE 10 MG/ML (PF) SYRINGE
PREFILLED_SYRINGE | INTRAVENOUS | Status: AC
  Filled 2023-05-20: qty 10

## 2023-05-20 MED ORDER — OXYCODONE-ACETAMINOPHEN 5-325 MG PO TABS: 1.0000 | ORAL_TABLET | ORAL | 0 refills | Status: AC | PRN

## 2023-05-20 MED ORDER — FENTANYL CITRATE (PF) 100 MCG/2ML IJ SOLN
INTRAMUSCULAR | Status: AC
  Filled 2023-05-20: qty 2

## 2023-05-20 MED ORDER — BUPIVACAINE LIPOSOME 1.3 % IJ SUSP
10.0000 mL | Freq: Once | INTRAMUSCULAR | Status: DC
Start: 2023-05-20 — End: 2023-05-20

## 2023-05-20 MED ORDER — SUGAMMADEX SODIUM 200 MG/2ML IV SOLN
INTRAVENOUS | Status: DC | PRN
  Administered 2023-05-20: 200 mg via INTRAVENOUS

## 2023-05-20 MED ORDER — DEXMEDETOMIDINE HCL IN NACL 80 MCG/20ML IV SOLN
INTRAVENOUS | Status: DC | PRN
  Administered 2023-05-20: 8 ug via INTRAVENOUS
  Administered 2023-05-20: 4 ug via INTRAVENOUS
  Administered 2023-05-20: 8 ug via INTRAVENOUS

## 2023-05-20 MED ORDER — LIDOCAINE HCL (CARDIAC) PF 100 MG/5ML IV SOSY
PREFILLED_SYRINGE | INTRAVENOUS | Status: DC | PRN
  Administered 2023-05-20: 100 mg via INTRAVENOUS

## 2023-05-20 MED ORDER — ACETAMINOPHEN 10 MG/ML IV SOLN
1000.0000 mg | Freq: Once | INTRAVENOUS | Status: DC | PRN
  Administered 2023-05-20: 1000 mg via INTRAVENOUS

## 2023-05-20 MED ORDER — ONDANSETRON HCL 4 MG/2ML IJ SOLN
INTRAMUSCULAR | Status: DC | PRN
  Administered 2023-05-20: 4 mg via INTRAVENOUS

## 2023-05-20 MED ORDER — PHENYLEPHRINE HCL-NACL 20-0.9 MG/250ML-% IV SOLN
INTRAVENOUS | Status: DC | PRN
  Administered 2023-05-20: 40 ug/min via INTRAVENOUS

## 2023-05-20 MED ORDER — HYDROMORPHONE HCL 1 MG/ML IJ SOLN
INTRAMUSCULAR | Status: DC | PRN
  Administered 2023-05-20 (×5): .4 mg via INTRAVENOUS

## 2023-05-20 MED ORDER — DEXAMETHASONE SODIUM PHOSPHATE 10 MG/ML IJ SOLN
INTRAMUSCULAR | Status: DC | PRN
Start: 2023-05-20 — End: 2023-05-20
  Administered 2023-05-20: 10 mg via INTRAVENOUS

## 2023-05-20 MED ORDER — LACTATED RINGERS IV SOLN: INTRAVENOUS | Status: DC

## 2023-05-20 MED ORDER — KETAMINE HCL 10 MG/ML IJ SOLN
INTRAMUSCULAR | Status: AC
  Filled 2023-05-20: qty 1

## 2023-05-20 MED ORDER — PROPOFOL 10 MG/ML IV BOLUS
INTRAVENOUS | Status: DC | PRN
  Administered 2023-05-20: 200 mg via INTRAVENOUS

## 2023-05-20 MED ORDER — FENTANYL CITRATE (PF) 100 MCG/2ML IJ SOLN
INTRAMUSCULAR | Status: DC | PRN
Start: 2023-05-20 — End: 2023-05-20
  Administered 2023-05-20: 100 ug via INTRAVENOUS

## 2023-05-20 MED ORDER — LIDOCAINE HCL (PF) 2 % IJ SOLN
INTRAMUSCULAR | Status: AC
  Filled 2023-05-20: qty 5

## 2023-05-20 MED ORDER — TIZANIDINE HCL 2 MG PO TABS: 2.0000 mg | ORAL_TABLET | Freq: Four times a day (QID) | ORAL | 0 refills | Status: AC | PRN

## 2023-05-20 MED ORDER — ACETAMINOPHEN 10 MG/ML IV SOLN
INTRAVENOUS | Status: AC
  Filled 2023-05-20: qty 100

## 2023-05-20 MED ORDER — KETAMINE HCL 10 MG/ML IJ SOLN
INTRAMUSCULAR | Status: DC | PRN
  Administered 2023-05-20: 30 mg via INTRAVENOUS
  Administered 2023-05-20: 20 mg via INTRAVENOUS

## 2023-05-20 MED ORDER — BUPIVACAINE LIPOSOME 1.3 % IJ SUSP
INTRAMUSCULAR | Status: AC
  Filled 2023-05-20: qty 10

## 2023-05-20 MED ORDER — SODIUM CHLORIDE (PF) 0.9 % IJ SOLN
INTRAMUSCULAR | Status: AC
  Filled 2023-05-20: qty 50

## 2023-05-20 MED ORDER — ROCURONIUM BROMIDE 100 MG/10ML IV SOLN
INTRAVENOUS | Status: DC | PRN
  Administered 2023-05-20: 100 mg via INTRAVENOUS

## 2023-05-20 MED ORDER — ONDANSETRON HCL 4 MG/2ML IJ SOLN: 4.0000 mg | Freq: Once | INTRAMUSCULAR | Status: DC | PRN

## 2023-05-20 MED ORDER — MIDAZOLAM HCL 5 MG/5ML IJ SOLN
INTRAMUSCULAR | Status: DC | PRN
  Administered 2023-05-20: 2 mg via INTRAVENOUS

## 2023-05-20 MED ORDER — INSULIN ASPART 100 UNIT/ML IJ SOLN: 0.0000 [IU] | INTRAMUSCULAR | Status: DC | PRN

## 2023-05-20 MED ORDER — HYDROMORPHONE HCL 2 MG/ML IJ SOLN
INTRAMUSCULAR | Status: AC
  Filled 2023-05-20: qty 1

## 2023-05-20 MED ORDER — TRANEXAMIC ACID-NACL 1000-0.7 MG/100ML-% IV SOLN: 1000.0000 mg | Freq: Once | INTRAVENOUS | Status: DC

## 2023-05-20 MED ORDER — LACTATED RINGERS IV BOLUS
500.0000 mL | Freq: Once | INTRAVENOUS | Status: AC
  Administered 2023-05-20: 500 mL via INTRAVENOUS

## 2023-05-20 MED ORDER — CEFAZOLIN SODIUM-DEXTROSE 3-4 GM/150ML-% IV SOLN
3.0000 g | INTRAVENOUS | Status: AC
  Administered 2023-05-20: 3 g via INTRAVENOUS
  Filled 2023-05-20: qty 150

## 2023-05-20 MED ORDER — STERILE WATER FOR IRRIGATION IR SOLN
Status: DC | PRN
  Administered 2023-05-20: 1000 mL

## 2023-05-20 MED ORDER — PHENYLEPHRINE 80 MCG/ML (10ML) SYRINGE FOR IV PUSH (FOR BLOOD PRESSURE SUPPORT)
PREFILLED_SYRINGE | INTRAVENOUS | Status: AC
  Filled 2023-05-20: qty 20

## 2023-05-20 MED ORDER — OXYCODONE HCL 5 MG PO TABS
ORAL_TABLET | ORAL | Status: AC
  Filled 2023-05-20: qty 1

## 2023-05-20 MED ORDER — POVIDONE-IODINE 10 % EX SWAB: 2.0000 | Freq: Once | CUTANEOUS | Status: DC

## 2023-05-20 MED ORDER — OXYCODONE HCL 5 MG/5ML PO SOLN: 5.0000 mg | Freq: Once | ORAL | Status: AC | PRN

## 2023-05-20 MED ORDER — SUCCINYLCHOLINE CHLORIDE 200 MG/10ML IV SOSY
PREFILLED_SYRINGE | INTRAVENOUS | Status: AC
Start: 2023-05-20 — End: ?
  Filled 2023-05-20: qty 10

## 2023-05-20 MED ORDER — PROPOFOL 500 MG/50ML IV EMUL
INTRAVENOUS | Status: DC | PRN
  Administered 2023-05-20: 50 ug/kg/min via INTRAVENOUS

## 2023-05-20 MED ORDER — CHLORHEXIDINE GLUCONATE 0.12 % MT SOLN
15.0000 mL | Freq: Once | OROMUCOSAL | Status: AC
  Administered 2023-05-20: 15 mL via OROMUCOSAL

## 2023-05-20 MED ORDER — LACTATED RINGERS IV BOLUS: 250.0000 mL | Freq: Once | INTRAVENOUS | Status: DC

## 2023-05-20 MED ORDER — 0.9 % SODIUM CHLORIDE (POUR BTL) OPTIME
TOPICAL | Status: DC | PRN
  Administered 2023-05-20: 1000 mL

## 2023-05-20 MED ORDER — TRANEXAMIC ACID 1000 MG/10ML IV SOLN
INTRAVENOUS | Status: DC | PRN
  Administered 2023-05-20: 2000 mg via TOPICAL

## 2023-05-20 MED ORDER — PROPOFOL 500 MG/50ML IV EMUL
INTRAVENOUS | Status: AC
  Filled 2023-05-20: qty 100

## 2023-05-20 MED ORDER — BUPIVACAINE-EPINEPHRINE (PF) 0.25% -1:200000 IJ SOLN
INTRAMUSCULAR | Status: AC
  Filled 2023-05-20: qty 30

## 2023-05-20 MED ORDER — ORAL CARE MOUTH RINSE: 15.0000 mL | Freq: Once | OROMUCOSAL | Status: AC

## 2023-05-20 MED ORDER — PROPOFOL 1000 MG/100ML IV EMUL
INTRAVENOUS | Status: AC
  Filled 2023-05-20: qty 100

## 2023-05-20 MED ORDER — OXYCODONE HCL 5 MG PO TABS
5.0000 mg | ORAL_TABLET | Freq: Once | ORAL | Status: AC | PRN
  Administered 2023-05-20: 5 mg via ORAL

## 2023-05-20 MED ORDER — TRANEXAMIC ACID-NACL 1000-0.7 MG/100ML-% IV SOLN
1000.0000 mg | INTRAVENOUS | Status: AC
  Administered 2023-05-20: 1000 mg via INTRAVENOUS
  Filled 2023-05-20: qty 100

## 2023-05-20 MED ORDER — PHENYLEPHRINE 80 MCG/ML (10ML) SYRINGE FOR IV PUSH (FOR BLOOD PRESSURE SUPPORT)
PREFILLED_SYRINGE | INTRAVENOUS | Status: DC | PRN
  Administered 2023-05-20: 160 ug via INTRAVENOUS

## 2023-05-20 MED ORDER — ALBUMIN HUMAN 5 % IV SOLN: INTRAVENOUS | Status: DC | PRN

## 2023-05-20 MED ORDER — SODIUM CHLORIDE (PF) 0.9 % IJ SOLN
INTRAMUSCULAR | Status: DC | PRN
  Administered 2023-05-20: 90 mL

## 2023-05-20 SURGICAL SUPPLY — 56 items
BAG COUNTER SPONGE SURGICOUNT (BAG) IMPLANT
BAG DECANTER FOR FLEXI CONT (MISCELLANEOUS) ×1 IMPLANT
BLADE SAW SGTL 73X25 THK (BLADE) IMPLANT
CHLORAPREP W/TINT 26 (MISCELLANEOUS) ×1 IMPLANT
COVER SURGICAL LIGHT HANDLE (MISCELLANEOUS) ×1 IMPLANT
CUP ACET PNNCL SECTR W/GRIP 56 (Hips) IMPLANT
DRAPE C-ARM 42X120 X-RAY (DRAPES) IMPLANT
DRAPE C-ARMOR (DRAPES) IMPLANT
DRAPE SHEET LG 3/4 BI-LAMINATE (DRAPES) ×1 IMPLANT
DRAPE SURG ORHT 6 SPLT 77X108 (DRAPES) ×2 IMPLANT
DRAPE U-SHAPE 47X51 STRL (DRAPES) ×1 IMPLANT
DRSG AQUACEL AG ADV 3.5X10 (GAUZE/BANDAGES/DRESSINGS) ×1 IMPLANT
DURAPREP 26ML APPLICATOR (WOUND CARE) IMPLANT
ELECT BLADE TIP CTD 4 INCH (ELECTRODE) ×1 IMPLANT
ELECT REM PT RETURN 15FT ADLT (MISCELLANEOUS) ×1 IMPLANT
GAUZE SPONGE 4X4 12PLY STRL (GAUZE/BANDAGES/DRESSINGS) IMPLANT
GAUZE XEROFORM 5X9 LF (GAUZE/BANDAGES/DRESSINGS) IMPLANT
GLOVE BIO SURGEON STRL SZ7.5 (GLOVE) ×1 IMPLANT
GLOVE BIO SURGEON STRL SZ8.5 (GLOVE) ×1 IMPLANT
GLOVE BIOGEL PI IND STRL 8 (GLOVE) ×1 IMPLANT
GLOVE BIOGEL PI IND STRL 9 (GLOVE) ×1 IMPLANT
GOWN STRL SURGICAL XL XLNG (GOWN DISPOSABLE) ×2 IMPLANT
HEAD FEM SROM 40 +3 XL (Hips) IMPLANT
HOLDER FOLEY CATH W/STRAP (MISCELLANEOUS) ×1 IMPLANT
HOOD PEEL AWAY T7 (MISCELLANEOUS) ×4 IMPLANT
IMMOBILIZER KNEE 20 (SOFTGOODS) IMPLANT
IMMOBILIZER KNEE 20 THIGH 36 (SOFTGOODS) IMPLANT
IV NS IRRIG 3000ML ARTHROMATIC (IV SOLUTION) ×1 IMPLANT
KIT BASIN OR (CUSTOM PROCEDURE TRAY) ×1 IMPLANT
KIT TURNOVER KIT A (KITS) IMPLANT
LINER NEUTRAL HIP ALTRX 40 56 (Liner) IMPLANT
NDL HYPO 21X1.5 SAFETY (NEEDLE) ×1 IMPLANT
NDL MAYO CATGUT SZ4 TPR NDL (NEEDLE) IMPLANT
NDL SAFETY ECLIPSE 18X1.5 (NEEDLE) ×1 IMPLANT
NEEDLE HYPO 21X1.5 SAFETY (NEEDLE) ×1 IMPLANT
NEEDLE MAYO CATGUT SZ4 (NEEDLE) ×1 IMPLANT
NS IRRIG 1000ML POUR BTL (IV SOLUTION) ×1 IMPLANT
PACK TOTAL JOINT (CUSTOM PROCEDURE TRAY) ×1 IMPLANT
PASSER SUT SWANSON 36MM LOOP (INSTRUMENTS) IMPLANT
PINN SECTOR W/GRIP ACE CUP 56 (Hips) ×1 IMPLANT
PROTECTOR NERVE ULNAR (MISCELLANEOUS) ×1 IMPLANT
SCREW 6.5MMX25MM (Screw) IMPLANT
SPIKE FLUID TRANSFER (MISCELLANEOUS) ×2 IMPLANT
SUT ETHIBOND 2 V 37 (SUTURE) ×3 IMPLANT
SUT VIC AB 0 CT1 36 (SUTURE) ×1 IMPLANT
SUT VIC AB 1 CTX36XBRD ANBCTR (SUTURE) ×1 IMPLANT
SUT VICRYL+ 3-0 36IN CT-1 (SUTURE) ×1 IMPLANT
SWAB COLLECTION DEVICE MRSA (MISCELLANEOUS) IMPLANT
SWAB CULTURE ESWAB REG 1ML (MISCELLANEOUS) IMPLANT
SYR CONTROL 10ML LL (SYRINGE) ×2 IMPLANT
TOWEL OR 17X26 10 PK STRL BLUE (TOWEL DISPOSABLE) ×1 IMPLANT
TOWER CARTRIDGE SMART MIX (DISPOSABLE) IMPLANT
TRAY CATH INTERMITTENT SS 16FR (CATHETERS) IMPLANT
TRAY FOLEY MTR SLVR 14FR STAT (SET/KITS/TRAYS/PACK) ×1 IMPLANT
TUBE SUCTION HIGH CAP CLEAR NV (SUCTIONS) ×1 IMPLANT
WATER STERILE IRR 1000ML POUR (IV SOLUTION) ×2 IMPLANT

## 2023-05-20 NOTE — Anesthesia Preprocedure Evaluation (Signed)
 Anesthesia Evaluation  Patient identified by MRN, date of birth, ID band Patient awake    Reviewed: Allergy & Precautions, NPO status , Patient's Chart, lab work & pertinent test results, reviewed documented beta blocker date and time   History of Anesthesia Complications Negative for: history of anesthetic complications  Airway Mallampati: III  TM Distance: >3 FB   Mouth opening: Limited Mouth Opening  Dental no notable dental hx.    Pulmonary neg COPD, Current Smoker   breath sounds clear to auscultation       Cardiovascular hypertension, + Peripheral Vascular Disease  (-) CAD, (-) Past MI, (-) Cardiac Stents and (-) CHF  Rhythm:Regular Rate:Normal     Neuro/Psych neg Seizures  Anxiety     CVA, No Residual Symptoms    GI/Hepatic ,neg GERD  ,,(+) neg Cirrhosis        Endo/Other    Renal/GU Renal disease     Musculoskeletal  (+) Arthritis ,    Abdominal   Peds  Hematology  (+) Blood dyscrasia   Anesthesia Other Findings   Reproductive/Obstetrics                             Anesthesia Physical Anesthesia Plan  ASA: 3  Anesthesia Plan: General   Post-op Pain Management:    Induction: Intravenous  PONV Risk Score and Plan: 2 and Ondansetron and Dexamethasone  Airway Management Planned: Video Laryngoscope Planned and Oral ETT  Additional Equipment:   Intra-op Plan:   Post-operative Plan: Extubation in OR  Informed Consent: I have reviewed the patients History and Physical, chart, labs and discussed the procedure including the risks, benefits and alternatives for the proposed anesthesia with the patient or authorized representative who has indicated his/her understanding and acceptance.     Dental advisory given  Plan Discussed with: CRNA  Anesthesia Plan Comments: (Has been on lovenox 1mg /kg - stopped 24h ago. Discussed risks and benefits of SAB vs GA, prefers GA. )        Anesthesia Quick Evaluation

## 2023-05-20 NOTE — Op Note (Signed)
 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  We revised to a 54 mm DePuy GRIPTION sector cup with a single screw, +4 poly liner to accept a +3 x 40 mm metal head.  Surgeon: Feliberto Gottron. Turner Daniels M.D.  Assistant: Tomi Likens. 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  Implants included DePuy Pinnacle shell polyethylene liner S-ROM stem of the ceramic head.  Patient did well for 12 years.  2 months ago he noticed some squeaking in his left total hip and came in for follow-up after being absent for 8 years.  Plain radiograph showed that the ceramic head was now articulating superiorly with a metal shell indicative of failure of the polyethylene liner.  Revision arthroplasty was discussed with the patient and preoperative clearance obtained he has an antiphospholipid syndrome and we have to manage his anticoagulants very carefully.  Preoperatively he was on a 120 mg Lovenox twice daily bridge.  Postoperatively he will be on the bridge for another 5 days as he resumes warfarin.  The risks and benefits of surgery been discussed and all questions answered.  Procedure: Patient was identified by arm band receive preoperative IV antibiotics in the pre-op holding area. Patient was taken to the operating room where the appropriate anesthetic monitors were attached and general endotracheal anesthesia induced. Patient was positioned into the right lateral decubitus position and fixed there with a mark 2 pelvic  clamp. The limb was prepped and draped in usual sterile fashion from the ankle to the hemipelvis. Time out procedure performed. Skin along the lateral hip and thigh infiltrated with 10 cc of 1/2% Marcaine and epinephrine solution. We began the operation by recreating the old posterior lateral incision 14 cm hrough the skin and subcutaneous tissue down to the level of the IT band which was cut in line with the skin incision. This exposed the greater trochanter.  We immediately encountered synovium that was stained black from titanium oxide as the ceramic head had been articulating directly with the titanium shell creating debris.  Using the Bovie and pickups a fairly radical synovectomy was then accomplished.  The hip was then flexed and internally rotated dislocating the ceramic ball from the fracture liner and the ball was removed with a drift and hammer.  The trunnion of the S-ROM stem appeared to be in good condition.  The hip was internally rotated and the trunnion placed superior and anterior to the Pinnacle shell.  An inferior cobra retractor was placed as well as a another Cobra just above the ischial tuberosity.  The liner which was fragmented was then removed the entire superior aspect of the liner was missing.  We could see inside of the shell where the ceramic head had eroded the titanium making the shell on suitable for reuse.  We then brought up the Endo med acetabular cup osteotomes and selected the short and long 52 mm curved osteotomes and removed the acetabular shell without much difficulty.  The remaining bone was in good condition we then sequentially reamed up to a 55 mm reamer  irrigated the acetabulum with normal saline solution he is a trans Amick acid soaked sponge to help obtain hemostasis and then hammered into place a new 54 mm DePuy GRIPTION sector cup and 45 degrees of abduction and 20 degrees of anteversion.  A single superior fixation screw was placed.  We then hammered into place a 40  mm +4 polyliner and performed a trial with a +3 x 40 mm head and found excellent stability to 90 of flexion with 75 internal rotation in the hip could not be dislocated anteriorly.  The trial ball was then removed and a new +3 x 40 mm ball was hammered onto the S-ROM stem.  The hip was again reduced and stability checked.  Again the wound was thoroughly irrigated out with normal saline solution. The mixture of Exparel and Marcaine was injected into the soft tissues using a 10 cc syringe with a 22-gauge by 1-1/2 inch needle.  The IT band was closed with running #1 Vicryl suture.  The subcutaneous tissue closed in layers with 3-0 Vicryl suture.  Subcuticular closure of the skin with 3-0 Vicryl suture.  An Aquacel dressing was applied.  The patient was unclamped rolled supine awakened and taken to the recovery room without difficulty.

## 2023-05-20 NOTE — Evaluation (Signed)
 Physical Therapy Brief Evaluation and Discharge Note Patient Details Name: Dominic Walters MRN: [id number] DOB: December 16, 1970 Today's Date: 05/20/2023   History of Present Illness  53 y/o s/p revision Left Total hip arthoplasty posteriorly. Due to Left hip failure of polyliner. PMH: R THA 2015 and L THA 2016 due to avascular necrosis, also hx of DVT, chronic back pain HTN, and CVA with residual numbness R hand 2014.  Clinical Impression  Pt tolerated session well in PACU after surgery. Pt remembers previous post op hip surgery. Review posterior hip precautions, reviewed mobility, ambulation with RW , steps, ice and progressing mobility at home. Pt and wife confident with progression and ability to get home safely. No further PT needs at this time.        PT Assessment Patient does not need any further PT services  Assistance Needed at Discharge  Intermittent Supervision/Assistance    Equipment Recommendations None recommended by PT;Other (comment) (pt has RW already in his car)  Recommendations for Other Services       Precautions/Restrictions Precautions Precautions: Posterior Hip Precaution Booklet Issued: Yes (comment) (handout) Precaution/Restrictions Comments: educated and demostrated posterior hip precuations Restrictions Weight Bearing Restrictions Per Provider Order: No        Mobility  Bed Mobility Rolling: Supervision Supine/Sidelying to sit: Supervision Sit to supine/sidelying: Supervision    Transfers Overall transfer level: Needs assistance Equipment used: Rolling walker (2 wheels) Transfers: Sit to/from Stand Sit to Stand: Supervision                Ambulation/Gait Ambulation/Gait assistance: Supervision Gait Distance (Feet): 75 Feet Assistive device: Rolling walker (2 wheels) Gait Pattern/deviations: Step-through pattern   General Gait Details: smooth gait pattern., did encourage pt to continue to use RW for next few days even though he is walking  with almost normal gait pattern, just to favor that side a little to decrease swelling and potnetial pain over next few days. Pt reported heading to OPPT over next few days.  Home Activity Instructions Home Activity Instructions: to no over do activity, ice as much as possible and move around a little throughout the day. Exercises 2x a day.  Stairs Stairs: Yes Stairs assistance: Supervision Stair Management: One rail Right Number of Stairs: 3 General stair comments: was abe to perform with ease  Modified Rankin (Stroke Patients Only)        Balance Overall balance assessment: No apparent balance deficits (not formally assessed)                        Pertinent Vitals/Pain   Pain Assessment Pain Assessment: 0-10 Pain Score: 2  Pain Location: a little in the Lhip, but pain with urination just now after surgery. Nurse made aware. Pain Descriptors / Indicators: Sore Pain Intervention(s): Monitored during session, Ice applied     Home Living   Living Arrangements: Spouse/significant other       Home Equipment: Agricultural consultant (2 wheels)        Prior Function        UE/LE Assessment   UE ROM/Strength/Tone/Coordination: WFL    LE ROM/Strength/Tone/Coordination: [facility]      Communication   Communication Communication: No apparent difficulties     Cognition Overall Cognitive Status: Appears within functional limits for tasks assessed/performed       General Comments      Exercises Total Joint Exercises Ankle Circles/Pumps: AROM, Left, 10 reps, Supine Quad Sets: AROM, Both, 10 reps Gluteal Sets: AROM, Both,  10 reps Heel Slides: AROM, Left, 10 reps, Supine (educated on not pulling leg up past 90 degrees in flexion even though he can.) Hip ABduction/ADduction: AROM, Left, 10 reps, Supine   Assessment/Plan    PT Problem List         PT Visit Diagnosis Other abnormalities of gait and mobility (R26.89)    No Skilled PT All education  completed;Patient will have necessary level of assist by caregiver at discharge   Co-evaluation                AMPAC 6 Clicks Help needed turning from your back to your side while in a flat bed without using bedrails?: None Help needed moving from lying on your back to sitting on the side of a flat bed without using bedrails?: None Help needed moving to and from a bed to a chair (including a wheelchair)?: None Help needed standing up from a chair using your arms (e.g., wheelchair or bedside chair)?: None Help needed to walk in hospital room?: A Little Help needed climbing 3-5 steps with a railing? : A Little 6 Click Score: 22      End of Session Equipment Utilized During Treatment: Gait belt Activity Tolerance: Patient tolerated treatment well Patient left: in bed;with nursing/sitter in room;with family/visitor present Nurse Communication: Mobility status PT Visit Diagnosis: Other abnormalities of gait and mobility (R26.89)     Time: 1800-1830 PT Time Calculation (min) (ACUTE ONLY): 30 min  Charges:   PT Evaluation $PT Eval Low Complexity: 1 Low      Wilmoth Rasnic, PT, MPT Acute Rehabilitation Services Office: [phone] If a weekend: secure chat groups: [facility] PT, [facility] OT, [facility] SLP 05/20/2023   Aceyn Kathol  05/20/2023, 7:22 PM

## 2023-05-20 NOTE — Interval H&P Note (Signed)
 History and Physical Interval Note:  05/20/2023 11:46 AM  Dominic Walters  has presented today for surgery, with the diagnosis of LEFT HIP FAILURE OF POLYLINER.  The various methods of treatment have been discussed with the patient and family. After consideration of risks, benefits and other options for treatment, the patient has consented to  Procedure(s) with comments: TOTAL HIP REVISION (Left) - REVISION LEFT HIP ARTHROPLASTY POSTERIOR as a surgical intervention.  The patient's history has been reviewed, patient examined, no change in status, stable for surgery.  I have reviewed the patient's chart and labs.  Questions were answered to the patient's satisfaction.     Nestor Lewandowsky

## 2023-05-20 NOTE — Discharge Instructions (Addendum)

## 2023-05-20 NOTE — Anesthesia Procedure Notes (Signed)
 Procedure Name: Intubation Date/Time: 05/20/2023 1:01 PM  Performed by: Jimmey Ralph, CRNAPre-anesthesia Checklist: Patient identified, Emergency Drugs available, Suction available and Patient being monitored Patient Re-evaluated:Patient Re-evaluated prior to induction Oxygen Delivery Method: Circle system utilized Preoxygenation: Pre-oxygenation with 100% oxygen Induction Type: IV induction Ventilation: Oral airway inserted - appropriate to patient size and Mask ventilation with difficulty Laryngoscope Size: Glidescope and 4 Grade View: Grade I Tube type: Oral Tube size: 7.5 mm Number of attempts: 1 Airway Equipment and Method: Stylet and Oral airway Placement Confirmation: ETT inserted through vocal cords under direct vision, positive ETCO2 and breath sounds checked- equal and bilateral Secured at: 23 cm Tube secured with: Tape Dental Injury: Teeth and Oropharynx as per pre-operative assessment  Difficulty Due To: Difficulty was anticipated

## 2023-05-20 NOTE — Transfer of Care (Signed)
 Immediate Anesthesia Transfer of Care Note  Patient: Dominic Walters  Procedure(s) Performed: TOTAL HIP REVISION (Left: Hip)  Patient Location: PACU  Anesthesia Type:General  Level of Consciousness: awake, sedated, and drowsy  Airway & Oxygen Therapy: Patient Spontanous Breathing and Patient connected to face mask oxygen  Post-op Assessment: Report given to RN and Post -op Vital signs reviewed and stable  Post vital signs: Reviewed and stable  Last Vitals:  Vitals Value Taken Time  BP 136/96 05/20/23 1548  Temp    Pulse 99 05/20/23 1551  Resp 16 05/20/23 1551  SpO2 95 % 05/20/23 1551  Vitals shown include unfiled device data.  Last Pain:  Vitals:   05/20/23 1047  TempSrc:   PainSc: 0-No pain         Complications:  Encounter Notable Events  Notable Event Outcome Phase Comment  Difficult to intubate - expected  Intraprocedure Filed from anesthesia note documentation.

## 2023-05-21 NOTE — Anesthesia Postprocedure Evaluation (Signed)
 Anesthesia Post Note  Patient: Dominic Walters  Procedure(s) Performed: TOTAL HIP REVISION (Left: Hip)     Patient location during evaluation: PACU Anesthesia Type: General Level of consciousness: awake Pain management: pain level controlled Vital Signs Assessment: post-procedure vital signs reviewed and stable Respiratory status: spontaneous breathing, nonlabored ventilation and respiratory function stable Cardiovascular status: blood pressure returned to baseline and stable Postop Assessment: no apparent nausea or vomiting Anesthetic complications: yes   Encounter Notable Events  Notable Event Outcome Phase Comment  Difficult to intubate - expected  Intraprocedure Filed from anesthesia note documentation.    Last Vitals:  Vitals:   05/20/23 1700 05/20/23 1800  BP: 116/84 127/86  Pulse: 81 87  Resp: 16 17  Temp:    SpO2: 92% 93%    Last Pain:  Vitals:   05/20/23 1800  TempSrc:   PainSc: 0-No pain                 Linton Rump

## 2023-05-22 ENCOUNTER — Encounter (HOSPITAL_COMMUNITY): Payer: Self-pay | Admitting: Orthopedic Surgery

## 2023-05-25 LAB — AEROBIC/ANAEROBIC CULTURE W GRAM STAIN (SURGICAL/DEEP WOUND)
Culture: NO GROWTH
Gram Stain: NONE SEEN
